# Patient Record
Sex: Female | Born: 1950 | Race: White | Hispanic: No | Marital: Married | Smoking: Never smoker
Health system: Southern US, Community
[De-identification: ages and names within clinical notes are randomized; demographics above are authoritative.]

## PROBLEM LIST (undated history)

## (undated) ENCOUNTER — Emergency Department (HOSPITAL_BASED_OUTPATIENT_CLINIC_OR_DEPARTMENT_OTHER): Payer: 59 | Source: Home / Self Care

## (undated) DIAGNOSIS — F32A Depression, unspecified: Secondary | ICD-10-CM

## (undated) DIAGNOSIS — M1990 Unspecified osteoarthritis, unspecified site: Secondary | ICD-10-CM

## (undated) DIAGNOSIS — S0181XA Laceration without foreign body of other part of head, initial encounter: Secondary | ICD-10-CM

## (undated) DIAGNOSIS — M797 Fibromyalgia: Secondary | ICD-10-CM

## (undated) DIAGNOSIS — N189 Chronic kidney disease, unspecified: Secondary | ICD-10-CM

## (undated) DIAGNOSIS — H9190 Unspecified hearing loss, unspecified ear: Secondary | ICD-10-CM

## (undated) DIAGNOSIS — H269 Unspecified cataract: Secondary | ICD-10-CM

## (undated) DIAGNOSIS — D649 Anemia, unspecified: Secondary | ICD-10-CM

## (undated) DIAGNOSIS — F329 Major depressive disorder, single episode, unspecified: Secondary | ICD-10-CM

## (undated) DIAGNOSIS — IMO0001 Reserved for inherently not codable concepts without codable children: Secondary | ICD-10-CM

## (undated) DIAGNOSIS — E039 Hypothyroidism, unspecified: Secondary | ICD-10-CM

## (undated) DIAGNOSIS — L409 Psoriasis, unspecified: Secondary | ICD-10-CM

## (undated) HISTORY — PX: COLONOSCOPY W/ POLYPECTOMY: SHX1380

## (undated) HISTORY — DX: Psoriasis, unspecified: L40.9

## (undated) HISTORY — DX: Depression, unspecified: F32.A

## (undated) HISTORY — DX: Hypothyroidism, unspecified: E03.9

## (undated) HISTORY — DX: Unspecified osteoarthritis, unspecified site: M19.90

## (undated) HISTORY — PX: ROTATOR CUFF REPAIR: SHX139

## (undated) HISTORY — DX: Fibromyalgia: M79.7

## (undated) HISTORY — DX: Major depressive disorder, single episode, unspecified: F32.9

---

## 1998-11-12 ENCOUNTER — Ambulatory Visit (HOSPITAL_COMMUNITY): Admission: RE | Admit: 1998-11-12 | Discharge: 1998-11-12 | Payer: Self-pay | Admitting: Obstetrics and Gynecology

## 1998-12-02 ENCOUNTER — Other Ambulatory Visit: Admission: RE | Admit: 1998-12-02 | Discharge: 1998-12-02 | Payer: Self-pay | Admitting: Obstetrics and Gynecology

## 1999-02-07 ENCOUNTER — Emergency Department (HOSPITAL_COMMUNITY): Admission: EM | Admit: 1999-02-07 | Discharge: 1999-02-07 | Payer: Self-pay | Admitting: Emergency Medicine

## 1999-02-07 ENCOUNTER — Encounter: Payer: Self-pay | Admitting: Emergency Medicine

## 1999-11-24 ENCOUNTER — Other Ambulatory Visit: Admission: RE | Admit: 1999-11-24 | Discharge: 1999-11-24 | Payer: Self-pay | Admitting: Obstetrics and Gynecology

## 2000-02-14 ENCOUNTER — Ambulatory Visit (HOSPITAL_COMMUNITY): Admission: RE | Admit: 2000-02-14 | Discharge: 2000-02-14 | Payer: Self-pay | Admitting: Endocrinology

## 2000-05-16 ENCOUNTER — Other Ambulatory Visit: Admission: RE | Admit: 2000-05-16 | Discharge: 2000-05-16 | Payer: Self-pay | Admitting: Obstetrics and Gynecology

## 2001-02-26 ENCOUNTER — Encounter: Admission: RE | Admit: 2001-02-26 | Discharge: 2001-02-26 | Payer: Self-pay | Admitting: *Deleted

## 2001-04-08 ENCOUNTER — Emergency Department (HOSPITAL_COMMUNITY): Admission: EM | Admit: 2001-04-08 | Discharge: 2001-04-08 | Payer: Self-pay | Admitting: Emergency Medicine

## 2001-06-25 ENCOUNTER — Encounter: Admission: RE | Admit: 2001-06-25 | Discharge: 2001-09-23 | Payer: Self-pay | Admitting: Endocrinology

## 2003-01-24 ENCOUNTER — Other Ambulatory Visit: Admission: RE | Admit: 2003-01-24 | Discharge: 2003-01-24 | Payer: Self-pay | Admitting: Obstetrics and Gynecology

## 2003-12-17 ENCOUNTER — Emergency Department (HOSPITAL_COMMUNITY): Admission: EM | Admit: 2003-12-17 | Discharge: 2003-12-17 | Payer: Self-pay | Admitting: Emergency Medicine

## 2004-10-31 HISTORY — PX: TOTAL HIP ARTHROPLASTY: SHX124

## 2004-11-30 ENCOUNTER — Other Ambulatory Visit: Admission: RE | Admit: 2004-11-30 | Discharge: 2004-11-30 | Payer: Self-pay | Admitting: Obstetrics and Gynecology

## 2005-08-23 ENCOUNTER — Inpatient Hospital Stay (HOSPITAL_COMMUNITY): Admission: RE | Admit: 2005-08-23 | Discharge: 2005-08-27 | Payer: Self-pay | Admitting: Orthopedic Surgery

## 2006-01-10 ENCOUNTER — Other Ambulatory Visit: Admission: RE | Admit: 2006-01-10 | Discharge: 2006-01-10 | Payer: Self-pay | Admitting: Obstetrics and Gynecology

## 2006-09-10 ENCOUNTER — Emergency Department (HOSPITAL_COMMUNITY): Admission: EM | Admit: 2006-09-10 | Discharge: 2006-09-10 | Payer: Self-pay | Admitting: Emergency Medicine

## 2007-12-17 ENCOUNTER — Encounter: Admission: RE | Admit: 2007-12-17 | Discharge: 2007-12-17 | Payer: Self-pay | Admitting: Nephrology

## 2008-04-21 ENCOUNTER — Encounter (INDEPENDENT_AMBULATORY_CARE_PROVIDER_SITE_OTHER): Payer: Self-pay | Admitting: *Deleted

## 2008-04-21 ENCOUNTER — Ambulatory Visit (HOSPITAL_COMMUNITY): Admission: RE | Admit: 2008-04-21 | Discharge: 2008-04-21 | Payer: Self-pay | Admitting: *Deleted

## 2010-02-22 ENCOUNTER — Encounter: Admission: RE | Admit: 2010-02-22 | Discharge: 2010-02-22 | Payer: Self-pay | Admitting: Nephrology

## 2010-10-31 HISTORY — PX: OTHER SURGICAL HISTORY: SHX169

## 2011-01-07 ENCOUNTER — Ambulatory Visit
Admission: RE | Admit: 2011-01-07 | Discharge: 2011-01-07 | Disposition: A | Discharge status: Home / Self Care | Payer: 59 | Source: Ambulatory Visit | Attending: Endocrinology | Admitting: Endocrinology

## 2011-01-07 ENCOUNTER — Inpatient Hospital Stay: Admission: RE | Admit: 2011-01-07 | Payer: Self-pay | Source: Ambulatory Visit

## 2011-01-07 ENCOUNTER — Other Ambulatory Visit: Payer: Self-pay | Admitting: Endocrinology

## 2011-01-07 DIAGNOSIS — R52 Pain, unspecified: Secondary | ICD-10-CM

## 2011-01-07 DIAGNOSIS — Z96649 Presence of unspecified artificial hip joint: Secondary | ICD-10-CM

## 2011-03-15 NOTE — Op Note (Signed)
NAMEFREDDYE, CARDAMONE                 ACCOUNT NO.:  [id number]   MEDICAL RECORD NO.:  [id number]          PATIENT TYPE:  AMB   LOCATION:  ENDO                         FACILITY:  [facility]   PHYSICIAN:  Georgiana Spinner, M.D.    DATE OF BIRTH:  12-19-1950   DATE OF PROCEDURE:  DATE OF DISCHARGE:                               OPERATIVE REPORT   PROCEDURE:  Colonoscopy.   INDICATIONS:  Colon cancer screening.   ANESTHESIA:  Fentanyl 100 mcg, Versed 6 mg.   PROCEDURE:  With the patient mildly sedated in the left lateral  decubitus position the Pentax videoscopic colonoscope was inserted in  the rectum and passed under direct vision to the cecum identified by  ileocecal valve and appendiceal orifice both which were photographed.  From this point the colonoscope was slowly withdrawn taking  circumferential views of colonic mucosa stopping in the ascending colon  where two polyps adjacent to each other were seen, photographed and  removed using hot biopsy forceps technique setting of 20/150 blended  current.  We then withdrew the colonoscope taking circumferential views  of the remaining colonic mucosa stopping only then in the rectum which  appeared normal on direct, showed hemorrhoids on retroflexed view.  The  endoscope was straightened, withdrawn.  The patient's vital signs, pulse  oximeter remained stable.  The patient tolerated the procedure well  without apparent complication.   FINDINGS:  1. Internal hemorrhoids.  2. Polyps of ascending colon, await biopsy report.  3. The patient will call me for results and follow-up with me as      needed as an outpatient.           ______________________________  Georgiana Spinner, M.D.     GMO/MEDQ  D:  04/21/2008  T:  04/21/2008  Job:  [id number]   cc:   Dineen Kid. Rana Snare, M.D.  Fax: [phone]   Hope M. Danella Deis, M.D.  Fax: [phone]

## 2011-03-18 NOTE — Discharge Summary (Signed)
NAMESTEFANI, [name]                 ACCOUNT NO.:  [id number]   MEDICAL RECORD NO.:  [id number]          PATIENT TYPE:  INP   LOCATION:  1510                         FACILITY:  [facility]   PHYSICIAN:  Madlyn Frankel. Charlann Boxer, M.D.  DATE OF BIRTH:  08/02/1951   DATE OF ADMISSION:  08/23/2005  DATE OF DISCHARGE:  08/27/2005                                 DISCHARGE SUMMARY   ADMISSION DIAGNOSES:  1.  Severe right hip osteoarthritis.  2.  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  3.  Hypothyroidism.  4.  Asthma.  5.  Fibromyalgia.  6.  Psoriasis.  7.  History of urinary tract infection.  8.  History of renal calculi.  9.  Colic.  10. Constipation.  11. Osteoarthritis.   DISCHARGE DIAGNOSES:  1.  Severe hip osteoarthritis.  2.  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  3.  Hypothyroidism.  4.  Asthma.  5.  Fibromyalgia.  6.  Psoriasis.  7.  History of urinary tract infection.  8.  History of renal calculi.  9.  Colic.  10. Constipation.  11. Osteoarthritis.  12. Mild postoperative anemia.   OPERATION:  On August 23, 2005, the patient underwent right total hip  replacement and arthroplasty with #22.0 modified for an added difficulty  secondary to obesity.   ASSISTANT:  D.L. Cherlynn June.   BRIEF HISTORY AND PHYSICAL:  This is a 60 year old lady with deteriorating  problems concerning her right hip with osteoarthritis.  Hip replacement was  put off for some time because she wanted to continue working until she  really could not get around anymore.  That has occurred where she has  difficulty getting about.  Hip injections, antiinflammatories and ambulatory-  assistant device have been used, but now does not allow her the activity she  desires.  After much discussion including the risks and benefits of surgery,  it was decided to go ahead with the above procedure.   COURSE IN THE EMERGENCY ROOM:  The patient tolerated the surgical procedure  quite well.  She is very slow with her early  activities due to her size, but  she did push through as much as she could for her postoperative hip  protocol.  She was placed on Coumadin postoperatively for prevention of DVT,  utilizing compression hose and sequential compression device.  A rolling  walker was needed with larger wheels and a larger commode seat.  She was  ambulating in the hallway, and felt safe and confident to go home.  The  wound was dry.  Neurovascular intact to the operative extremity, and the  calf was soft.  It was felt she could be maintained in her home environment  so arrangements were made for discharge.  She was noted to have a low-grade  temp.  We encouraged the incentive spirometer, and used Tylenol.   LABORATORY DATA:  Laboratory values in the hospital, hematologically, showed  admission CBC which was completely within normal limits.  Hemoglobin was  13.6; hematocrit was 39.2.  Final hemoglobin was 10.4.  Hematocrit was 29.7.  Chemistries remained  normal throughout her hospitalization.  Urinalysis  showed some leukocyte esterase but not really a urinary tract infection, and  the patient did receive perioperative antibiotics.  EKG showed a normal  sinus rhythm.  No chest x-ray seen on this chart.   CONDITION ON DISCHARGE:  Improved, and stable.   PLAN:  The patient was discharged to her home to continue with incentive  spirometer, and use laxative of choice.  She will return to see Dr. Charlann Boxer 2  weeks after the date of surgery, continue with home medications and diet.  Use dry dressings p.r.n. to the wound.  Robaxin 500 mg for muscle relaxants  and Coumadin per pharmacy as well as Vicodin for pain.  She is urged to call  if any problems.  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   DICTATED FOR:  Madlyn Frankel. Charlann Boxer, M.D.      Dooley L. Cherlynn June.      Madlyn Frankel Charlann Boxer, M.D.  Electronically Signed    DLU/MEDQ  D:  09/02/2005  T:   09/02/2005  Job:  [id number]   cc:   Madlyn Frankel Charlann Boxer, M.D.  Fax: [phone]

## 2011-03-18 NOTE — H&P (Signed)
[name], [name]                 ACCOUNT NO.:  [id number]   MEDICAL RECORD NO.:  [id number]          PATIENT TYPE:   LOCATION:                                 FACILITY:   PHYSICIAN:  Madlyn Frankel. Charlann Boxer, M.D.  DATE OF BIRTH:  30-Jun-1951   DATE OF ADMISSION:  08/23/2005  DATE OF DISCHARGE:                                HISTORY & PHYSICAL   PRINCIPAL DIAGNOSIS:  Right hip osteoarthritis.   SECONDARY DIAGNOSES:  1.  History of a club foot on the right.  2.  History of hypothyroidism.  3.  History of asthma.  4.  Fibromyalgia.  5.  Psoriasis.  6.  History of urinary tract infections.  7.  Kidney stones.  8.  Colic.  9.  Constipation.  10. Osteoarthritis.  11. History of menopause.  12. Right shoulder rotator cuff repair.  13. History of calcium deposits in the right shoulder.   HISTORY:  [name] is a 60 year old female who I have been following in the  office with a history of right hip osteoarthritis. She has been followed and  managed in a conservative fashion including injections and attempts at  limited activity and antiinflammatories. These have not provided any long-  term or adequate relief. Quality of life is basically diminished  significantly as she is unable to perform her job due to her discomfort.   At this point after reviewing and failing of conservative measures, she  wished to proceed with a right total hip replacement.   PAST MEDICAL HISTORY:  History of club foot, hypothyroidism, asthma,  fibromyalgia, psoriasis, frequent urinary tract infections, kidney stones,  colic, history of gallbladder disease, constipation, osteoarthritis,  menopause.   PAST SURGICAL HISTORY:  Right shoulder partial rotator cuff repair and  excision of calcium deposits.   FAMILY HISTORY:  Diabetes, lung disease, stroke.   ALLERGIES:  CODEINE and IVP DYE.   MEDICATIONS:  Ultracet, bupropion, Tricor, Altace, levothyroxine,  multivitamin, Flovent inhaler, Singulair,  furosemide, ? tablets, loratadine,  folic acid and calcium.   REVIEW OF SYMPTOMS:  Otherwise unremarkable particularly for any  genitourinary, upper respiratory, pulmonary or cardiac issues.   PHYSICAL EXAMINATION:  VITAL SIGNS:  Temperature of 99.0, pulse 68, blood  pressure 140/72.  GENERAL:  She is awake, alert, oriented and cooperative.  HEENT:  Examination of the neck reveals a supple neck exam with no nodes  palpable. She has no bruits detected.  CHEST:  Clear to auscultation bilaterally. No rales.  HEART:  Regular rate and rhythm with no murmurs detected.  ABDOMEN:  Obese with palpable bowel sounds.  EXTREMITIES:  Painful limited range of motion has been previously noted on  examination and is neurovascularly intact. She has palpable pulses, intact  motor and sensory function, no evidence of psoriasis around the right hip.   Radiographs revealed right hip end-stage osteoarthritis.   IMPRESSION:  Right hip osteoarthritis.   PLAN:  The patient will be admitted for same day surgery on August 23, 2005. We had an extensive discussion today on risks and benefits including  bearing surfaces. The plan will  be for Ms. Hagee to have a ceramic on  polyethelene total hip replacement on the right. Radiographs reveal  significant valgus position of her femoral neck and this will be fully  evaluated in the OR. Questions were encouraged and answered.      Madlyn Frankel Charlann Boxer, M.D.  Electronically Signed     MDO/MEDQ  D:  08/25/2005  T:  08/25/2005  Job:  [id number]

## 2011-03-18 NOTE — Op Note (Signed)
[name], Parrish                 ACCOUNT NO.:  [id number]   MEDICAL RECORD NO.:  [id number]          PATIENT TYPE:  INP   LOCATION:  0007                         FACILITY:  [facility] [place]   PHYSICIAN:  Madlyn Frankel. Charlann Boxer, M.D.  DATE OF BIRTH:  [birth year]   DATE OF PROCEDURE:  08/23/2005  DATE OF DISCHARGE:                                 OPERATIVE REPORT   PREOPERATIVE DIAGNOSIS:  Right hip osteoarthritis.   POSTOPERATIVE DIAGNOSIS:  Right hip osteoarthritis.   PROCEDURE:  Right total hip replacement with 22 modifier for added  difficulty secondary to obesity.   SURGEON:  Madlyn Frankel. Charlann Boxer, M.D.   ASSISTANTDruscilla Brownie. Cherlynn June.   COMPONENTS USED:  DePuy hip system with a size 52 Pinnacle cup with a 32  neutral Marathon liner, a 7.5 mm Trilock standard femoral stem with a 32+1  ceramic and Delta ball.   ANESTHESIA:  General.   ESTIMATED BLOOD LOSS:  450.   DRAINS:  One.   COMPLICATIONS:  None.   INDICATIONS FOR PROCEDURE:  [name] is a 60 year old female who I have been  following for some time for right hip osteoarthritis in addition to social  history regarding her working and wanting to work as long as possible as  well as her size.  Hip replacement has been put off for some time.  Conservative measures have all failed, including hip injections, use of  assistive device and anti-inflammatories.  At this point, she wishes to  proceed with the hip replacement.  She does have a history of chronic  proteinuria with a questionable renal history.  Based on her size and  concerns with ceramics and fracture as well as the concern for metal-on-  metal ion debris and her kidney function, the decision was made for a  ceramic-on-polyethylene liner.  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   PROCEDURE IN DETAIL:  Patient was brought to the operating  theater.  Once  adequate anesthesia, preoperative antibiotics with 1 gm of Ancef were  administered, the patient was positioned in the left lateral decubitus  position with the right side up.  The curvilinear, lateral-based incision  was made for a posterior approach to the hip.  The patient was noted to have  an approximately 5-6 inches worth of subcutaneous fat prior to reaching her  iliotibial band.  The iliotibial band and gluteus maximus fascia were  incised in line with the incision.  Short external rotators were taken down  separate from the posterior capsule.  A capsulectomy was performed  posteriorly.  The hip was dislocated.  The patient was noted to have pretty  significant osteophytes posteriorly and anteriorly.  A neck osteotomy was  made based off the preoperative templating and anatomic landmarks.  Attention was first directed to the femur.  Femoral exposure was obtained.  A starting box cut was positioned in the posterior aspect of the femur to  allow for enough anteversion of the femur.  Broaching commenced with the  smallest broach and was carried up to a 7.5, which had excellent purchase.  With this, attention was directed to the acetabulum.  Acetabular exposure  was obtained in a routine fashion with the use of routine retractors.   The patient was noted to have pretty significant osteophytes.  The portion  of the posterior osteophyte, not acetabular wall, had fractured off of this,  dislocating the head.  These were removed at th time of placement of the  final cup.  I used minimally invasive curved reamers to get in to the size  of her hip.  Reaming commenced through 43 reamers straight through to the  medial wall.  I then reamed up the 51 reamer.  A final 52 cup was then  positioned with the cup positioned at 45 degrees of abduction and 10-15  degrees of forward flexion at her anterior rim.  The posterior aspect of the  cup appeared to be at the level of the ischium  posteriorly.   A single cancellous screw was placed in the ileum, which had excellent  purchase.  Combining with the initial scratch, no other screws were placed.  A trial liner was then placed.  Attention was directed to a trial reduction.  The 7.5 broach was positioned with a standard neck and 32 head.  The leg  lengths appeared equal, compared to the preoperative state, compared to the  down leg.  There was about 1 mm of shuck.  The hip range of motion was noted  to be very good.  Final components were brought to the field.  Once the  trial components were removed, I did pack some bone graft behind the  acetabular shell through the central hole and then placed a man hole cover.  The final 32 neutral Marathon liner was then impacted into the shell without  difficulty.  The final 7.5 standard Trilock stem was then impacted to a  level of the broach.  Based on this, the final 32+1 ball was then impacted  on a clean, dried trunnion.  The hip reduced.   The hip was copiously irrigated throughout the case and at the end.  A  medium Hemovac drain was placed deep.  Short external rotators were not able  to be reapproximated without excessive tension and were not reapproximated.  Capsulectomy was performed.  The iliotibial band was reapproximated using #1  Ethibond, then the gluteus fascia with #1 Vicryl.  The remainder of the  wound was closed in layers with a running 4-0 Monocryl on the skin.  The hip  was cleaned, dried, and dressed sterilely with Steri-Strips, dressings,  sponge, and tape.  Patient was extubated and brought to the recovery room in  stable condition.      Madlyn Frankel Charlann Boxer, M.D.  Electronically Signed     MDO/MEDQ  D:  08/23/2005  T:  08/23/2005  Job:  [id number]

## 2011-08-23 ENCOUNTER — Other Ambulatory Visit (HOSPITAL_COMMUNITY): Payer: Self-pay | Admitting: Orthopedic Surgery

## 2011-08-23 ENCOUNTER — Other Ambulatory Visit: Payer: Self-pay | Admitting: Orthopedic Surgery

## 2011-08-23 ENCOUNTER — Other Ambulatory Visit: Payer: Self-pay | Admitting: Nephrology

## 2011-08-23 ENCOUNTER — Ambulatory Visit (HOSPITAL_COMMUNITY)
Admission: RE | Admit: 2011-08-23 | Discharge: 2011-08-23 | Disposition: A | Discharge status: Home / Self Care | Payer: 59 | Source: Ambulatory Visit | Attending: Orthopedic Surgery | Admitting: Orthopedic Surgery

## 2011-08-23 ENCOUNTER — Encounter (HOSPITAL_COMMUNITY): Payer: 59

## 2011-08-23 DIAGNOSIS — M1710 Unilateral primary osteoarthritis, unspecified knee: Secondary | ICD-10-CM

## 2011-08-23 DIAGNOSIS — E7800 Pure hypercholesterolemia, unspecified: Secondary | ICD-10-CM | POA: Insufficient documentation

## 2011-08-23 DIAGNOSIS — J45909 Unspecified asthma, uncomplicated: Secondary | ICD-10-CM | POA: Insufficient documentation

## 2011-08-23 DIAGNOSIS — IMO0001 Reserved for inherently not codable concepts without codable children: Secondary | ICD-10-CM | POA: Insufficient documentation

## 2011-08-23 DIAGNOSIS — Z01818 Encounter for other preprocedural examination: Secondary | ICD-10-CM | POA: Insufficient documentation

## 2011-08-23 DIAGNOSIS — Z79899 Other long term (current) drug therapy: Secondary | ICD-10-CM | POA: Insufficient documentation

## 2011-08-23 DIAGNOSIS — Z01812 Encounter for preprocedural laboratory examination: Secondary | ICD-10-CM | POA: Insufficient documentation

## 2011-08-23 DIAGNOSIS — Z01810 Encounter for preprocedural cardiovascular examination: Secondary | ICD-10-CM | POA: Insufficient documentation

## 2011-08-23 LAB — DIFFERENTIAL
Basophils Relative: 0 % (ref 0–1)
Eosinophils Absolute: 0.5 10*3/uL (ref 0.0–0.7)
Eosinophils Relative: 7 % — ABNORMAL HIGH (ref 0–5)
Lymphs Abs: 2.3 10*3/uL (ref 0.7–4.0)
Neutrophils Relative %: 54 % (ref 43–77)

## 2011-08-23 LAB — PROTIME-INR: Prothrombin Time: 13.9 seconds (ref 11.6–15.2)

## 2011-08-23 LAB — IRON AND TIBC
Saturation Ratios: 25 % (ref 20–55)
UIBC: 234 ug/dL (ref 125–400)

## 2011-08-23 LAB — URINALYSIS, ROUTINE W REFLEX MICROSCOPIC
Leukocytes, UA: NEGATIVE
Nitrite: NEGATIVE
Protein, ur: 100 mg/dL — AB
Urobilinogen, UA: 0.2 mg/dL (ref 0.0–1.0)

## 2011-08-23 LAB — COMPREHENSIVE METABOLIC PANEL
AST: 23 U/L (ref 0–37)
CO2: 22 mEq/L (ref 19–32)
Calcium: 9.8 mg/dL (ref 8.4–10.5)
Creatinine, Ser: 1.71 mg/dL — ABNORMAL HIGH (ref 0.50–1.10)
GFR calc Af Amer: 37 mL/min — ABNORMAL LOW (ref >90)
GFR calc non Af Amer: 32 mL/min — ABNORMAL LOW (ref >90)
Glucose, Bld: 86 mg/dL (ref 70–99)
Total Protein: 7.1 g/dL (ref 6.0–8.3)

## 2011-08-23 LAB — URINE MICROSCOPIC-ADD ON

## 2011-08-23 LAB — PROTEIN, URINE, RANDOM: Total Protein, Urine: 107 mg/dL

## 2011-08-23 LAB — SURGICAL PCR SCREEN
MRSA, PCR: NEGATIVE
Staphylococcus aureus: NEGATIVE

## 2011-08-23 LAB — CBC
MCV: 90.5 fL (ref 78.0–100.0)
Platelets: 228 10*3/uL (ref 150–400)
RDW: 13.1 % (ref 11.5–15.5)
WBC: 6.7 10*3/uL (ref 4.0–10.5)

## 2011-08-23 LAB — APTT: aPTT: 32 seconds (ref 24–37)

## 2011-08-23 LAB — T4: T4, Total: 9.8 ug/dL (ref 5.0–12.5)

## 2011-08-30 ENCOUNTER — Ambulatory Visit (HOSPITAL_COMMUNITY)
Admission: RE | Admit: 2011-08-30 | Discharge: 2011-09-01 | Disposition: A | Discharge status: Home Health | Payer: 59 | Source: Ambulatory Visit | Attending: Orthopedic Surgery | Admitting: Orthopedic Surgery

## 2011-08-30 DIAGNOSIS — J45909 Unspecified asthma, uncomplicated: Secondary | ICD-10-CM | POA: Insufficient documentation

## 2011-08-30 DIAGNOSIS — E079 Disorder of thyroid, unspecified: Secondary | ICD-10-CM | POA: Insufficient documentation

## 2011-08-30 DIAGNOSIS — M1710 Unilateral primary osteoarthritis, unspecified knee: Principal | ICD-10-CM | POA: Insufficient documentation

## 2011-08-30 DIAGNOSIS — N182 Chronic kidney disease, stage 2 (mild): Secondary | ICD-10-CM | POA: Insufficient documentation

## 2011-08-30 DIAGNOSIS — Z96649 Presence of unspecified artificial hip joint: Secondary | ICD-10-CM | POA: Insufficient documentation

## 2011-08-30 DIAGNOSIS — IMO0001 Reserved for inherently not codable concepts without codable children: Secondary | ICD-10-CM | POA: Insufficient documentation

## 2011-08-30 DIAGNOSIS — L408 Other psoriasis: Secondary | ICD-10-CM | POA: Insufficient documentation

## 2011-08-30 DIAGNOSIS — E7800 Pure hypercholesterolemia, unspecified: Secondary | ICD-10-CM | POA: Insufficient documentation

## 2011-08-30 LAB — TYPE AND SCREEN

## 2011-08-30 LAB — ABO/RH: ABO/RH(D): AB POS

## 2011-08-30 LAB — GLUCOSE, CAPILLARY

## 2011-08-31 LAB — BASIC METABOLIC PANEL
CO2: 20 mEq/L (ref 19–32)
Calcium: 9 mg/dL (ref 8.4–10.5)
Creatinine, Ser: 2.06 mg/dL — ABNORMAL HIGH (ref 0.50–1.10)
Glucose, Bld: 106 mg/dL — ABNORMAL HIGH (ref 70–99)

## 2011-08-31 LAB — CBC
HCT: 30.9 % — ABNORMAL LOW (ref 36.0–46.0)
Hemoglobin: 10.2 g/dL — ABNORMAL LOW (ref 12.0–15.0)
MCH: 30.1 pg (ref 26.0–34.0)
MCV: 91.2 fL (ref 78.0–100.0)
RBC: 3.39 MIL/uL — ABNORMAL LOW (ref 3.87–5.11)

## 2011-09-01 NOTE — H&P (Signed)
[name], [name]                 ACCOUNT NO.:  [id number]  MEDICAL RECORD NO.:  [id number]  LOCATION:                               FACILITY:  [facility]  PHYSICIAN:  Madlyn Frankel. Charlann Boxer, M.D.  DATE OF BIRTH:  1951-06-19  DATE OF ADMISSION:  08/30/2011 DATE OF DISCHARGE:                             HISTORY & PHYSICAL   DATE OF SURGERY:  August 30, 2011.  CHIEF COMPLAINT:  Left knee osteoarthritis, medial greater than lateral.  HISTORY OF PRESENT ILLNESS:  The patient is a 60 year old white female, in no acute distress.  The patient has left knee pain for a number of years.  The patient has tried conservative treatments including steroid injections and anti-inflammatories, which have been minimally effective at best.  The patient has also had a U-Flex injection which did help for sometime, but the pain quickly returned a little while and after x-rays in the clinic do show osteoarthritis changes medial greater than lateral without bone-on-bone medial aspect.  Options were discussed with the patient.  The patient wished to proceed with surgery.  Risks, benefits, and expectations of procedure were discussed with the patient.  The patient understands risks, benefits, and expectation and wished to proceed with a left knee unilateral knee replacement on the medial aspect.  On discharge, the patient is planning on going home.  The patient's pain medicines have been given including Robaxin, Colace, MiraLax, and iron.  The patient has not been given anything for anticoagulation due to not able to be taking aspirin at this time.  The patient is a candidate for tranexamic acid and will be given this prior to surgery.  PRIMARY CARE PHYSICIAN:  Adela Lank, MD.  PAST MEDICAL HISTORY: 1. Asthma. 2. Hypercholesteremia. 3. History of shingles. 4. Thyroid disease. 5. Kidney disease. 6. Fibromyalgia. 7. Arthritis. 8. Psoriasis. 9. Peripheral edema.  PAST SURGICAL HISTORY: 1. Removal of  calcium deposits in the right upper arm. 2. Results from the first surgery caused a partial tear of the rotator     cuff which was repaired and a subsequent surgery. 3. Right total hip arthroplasty.  MEDICATIONS: 1. Ramipril 5 mg 1 p.o. in a.m. and 3 p.o. in the p.m. 2. Bupropion HCl SR 150 mg 1 p.o. b.i.d. 3. Singular 10 mg 1 p.o. at bedtime. 4. Furosemide 40 mg daily. 5. Tramadol 50 mg 2-3 tabs daily. 6. Synthroid 112 mcg daily. 7. Crestor 20 mg daily. 8. Fish oil 1500 mg b.i.d. (stopped a week prior to surgery. 9. Multivitamin 1 p.o. daily (stopped a week before surgery). 10.Albendazole ointment. 11.Fluocinonide ointment. 12.Albuterol inhaler p.r.n.  ALLERGIES: 1. CODEINE causes nausea and vomiting.  The patient does take     hydrocodone without problems, just limited. 2. IVP DYE causes anaphylaxis. 3. ROCEPHALIN causes anaphylaxis. 4. PAPER TAPE causes an allergic reaction on the skin.  SOCIAL HISTORY:  The patient denies use of alcohol or tobacco.  REVIEW OF SYSTEMS:  GENERAL:  The patient has recently been attempting to lose weight and has lost 20 pounds.  HEENT:  Hearing loss. DERMATOLOGY:  Causes itching psoriasis.  RESPIRATORY:  History of allergies.  GI:  Constipation issues.  MUSCULOSKELETAL:  Joint pain, muscular pain, spasms, and morning stiffness, otherwise unremarkable.  PHYSICAL EXAMINATION:  GENERAL:  The patient is a 59 year old, white female, in no acute distress. VITAL SIGNS:  Stable.  Blood pressure in the left arm is 134/78, pulse 86, and respirations 18. HEENT:  Pupils equal, round, react well to light and accommodation. Throat is clear. NECK:  Supple.  No JVD noted.  No carotid bruits.  No lymphadenopathy noted. CARDIA:  Normal-appearing S1 and S2.  No murmur appreciated. RESPIRATORY:  Lungs clear to auscultation bilaterally. NEUROLOGIC:  The patient is oriented x3. ORTHOPEDICS:  Pertaining to the left knee, the patient has pain on palpation over  the medial aspect.  There is no pain of the lateral joint line.  There is no pain of the quadriceps tendon, patellar tendon, or patella.  The patient does have range of motion from 0-100 with discomfort at the extremes of her range of motion.  She is distally neurovascularly intact.  The patient does have some distal lower leg swelling.  IMPRESSION:  Left knee osteoarthritis.  STUDIES:  Showed left knee osteoarthritis with bone-on-bone changes. Palpation on lateral aspect is  stable.  PLAN:  The patient will be admitted to the hospital to undergo left knee unilateral knee replacement on the medial aspect per Dr. Charlann Boxer at [facility] on August 30, 2011, on current observation basis.  Risks, benefits, and expectation of procedure discussed with the patient.  The patient understands risks, benefits, and expectations and wishes to proceed with surgery.    ______________________________ Lanney Gins, PA   ______________________________ Madlyn Frankel. Charlann Boxer, M.D.    MB/MEDQ  D:  08/26/2011  T:  08/26/2011  Job:  [id number]  Electronically Signed by Lanney Gins PA on 08/30/2011 02:17:32 PM Electronically Signed by Durene Romans M.D. on 09/01/2011 11:15:02 AM

## 2011-09-01 NOTE — Discharge Summary (Signed)
[name], [name] NO.:  [id number]  MEDICAL RECORD NO.:  [id number]  LOCATION:  1608                         FACILITY:  [facility]  PHYSICIAN:  [name] Frankel. Charlann Parrish, M.D.  DATE OF BIRTH:  [birth year]  DATE OF ADMISSION:  08/30/2011 DATE OF DISCHARGE:  08/31/2011                              DISCHARGE SUMMARY   PROCEDURES:  Left unicompartmental knee arthroplasty.  ADMITTING DIAGNOSIS:  Left knee osteoarthritis, medial compartment.  DISCHARGE DIAGNOSES: 1. Status post left unicompartmental knee arthroplasty, medial. 2. Asthma. 3. Hypercholesteremia. 4. History of shingles. 5. Thyroid disease. 6. Kidney disease. 7. Fibromyalgia. 8. Arthritis. 9. Stresses. 10.Peripheral edema.  HISTORY OF PRESENT ILLNESS:  The patient is a 60 year old white female, in no acute distress.  The patient has left knee pain for a number of years.  The patient has tried conservative treatment including steroid injections and antiinflammatories, which had been minimally effective at past.  The patient has also had a duplex injection which did help for some time.  The pain quickly returned.  X-rays in the clinic do reveal osteoarthritic changes on the medial aspect with stable lateral aspect. Options were discussed with the patient.  The patient was proceed with surgery.  Risks, benefits, and expectations of procedure were discussed. The patient understands risks, benefits, and expectations and wished to proceed with the left unicompartmental knee arthroplasty per Dr. Charlann Parrish at [facility].  HOSPITAL COURSE:  The patient underwent the above-stated procedure on August 30, 2011.  The patient tolerated the procedure well, was brought to the recovery room in good condition, and subsequently to the floor.  Postop day #1, August 31, 2011, the patient is doing well, no events. Pain is controlled.  She is afebrile.  Vital signs stable.  H and H are 10.2/30.9.  She is distally  neurovascular intact.  Dressing is good, clean, dry, and intact.  The patient does have some cellulitis and will be discharged home on amoxicillin.  The patient will have physical therapy and occupational therapy x2.  Hemovac drain was removed.  Ace bandage was removed.  I felt that the patient must be doing well enough to be discharged home.  DISCHARGE CONDITION:  Good.  DISCHARGE INSTRUCTIONS:  The patient will be discharged home with home health PT after having physical therapy in the hospital.  The patient will be weightbearing as tolerated.  Should maintain her surgical dressings for about 8 days after which time she will replace with gauze and tape.  The patient to keep the area dry and clean until followup. She will follow up in 2 weeks at [facility].  The patient is to call with any questions or concerns.  DISCHARGE MEDICATIONS: 1. Benadryl 25 mg one p.o. q.4 h. p.r.n. 2. Colace 100 mg one p.o. b.i.d., constipation. 3. Iron sulfate 325 mg one p.o. t.i.d. x2-3 weeks. 4. Norco 7.5/325, 1-2 p.o. q.4-6 h. p.r.n. pain. 5. Robaxin 500 mg one p.o. q.6 h. p.r.n. muscle spasms. 6. MiraLax 17 g p.o. q. day, constipation. 7. Xarelto 10 mg p.o. q. day for 14 days, anticoagulation. 8. Albuterol inhaler 2 puffs inhaled every 4 hours as needed for  shortness of breath. 9. Altace 5 mg, 1 capsule in the morning and 3 capsules in the     evening. 10.Crestor 20 mg one p.o. q.a.m. 11.Fish oil 1 p.o. q. day. 12.Fluocinonide topical 0.5%, 1 application topically daily. 13.Lasix 40 mg one p.o. q. day. 14.Multivitamin 1 p.o. q. day. 15.Singular 10 mg one p.o. nightly. 16.Synthroid 112 mcg one p.o. q.a.m. 17.Ultravate cream 0.05%, 1 application topically daily. 18.Wellbutrin SR 150 mg one p.o. b.i.d.    ______________________________ [name] Gins, PA   ______________________________ [name] Frankel. Charlann Parrish, M.D.    MB/MEDQ  D:  08/31/2011  T:  08/31/2011  Job:  [id number]  cc:    Brooke Bonito, M.D. Fax: [phone]  Electronically Signed by [name] Gins PA on 09/01/2011 10:32:58 AM Electronically Signed by Durene Romans M.D. on 09/01/2011 11:15:57 AM

## 2011-09-01 NOTE — Op Note (Signed)
[name] NO.:  [id number]  MEDICAL RECORD NO.:  [id number]  LOCATION:  1608                         FACILITY:  [facility]  PHYSICIAN:  Madlyn Frankel. Charlann Boxer, M.D.  DATE OF BIRTH:  Jan 25, 1951  DATE OF PROCEDURE:  08/30/2011 DATE OF DISCHARGE:                              OPERATIVE REPORT   PREOPERATIVE DIAGNOSIS:  Left knee medial compartment osteoarthritis.  POSTOPERATIVE DIAGNOSIS:  Left knee medial compartment osteoarthritis.  PROCEDURE:  Left partial knee replacement utilizing Biomet Oxford knee implant size medium femur, left 8 tibial tray and size 4 insert.  SURGEON:  Madlyn Frankel. Charlann Boxer, M.D.  ASSISTANT:  Lanney Gins, [facility]  ANESTHESIA:  General.  SPECIMENS:  None.  COMPLICATION:  None.  BLOOD LOSS:  Less than 100 mL.  TOURNIQUET TIME:  58 minutes at 250 mmHg.  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  She has had progressive worsening of her left knee symptoms with radiographic changes consistent with bone on bone arthritis, predominantly localized to the medial compartment with an anterior medial wear pattern noted.  Radiographic workup revealed medial compartment degenerative changes with predominant issue given the desired activity level as well as options for future surgeries.  She wished at this point to proceed with partial knee replacement.  She has failed injections, medications, activity modification, and attempted weight loss.  Consent was obtained for benefit of pain relief.  PROCEDURE IN DETAIL:  The patient was brought to the operative theater. Once adequate anesthesia, preoperative antibiotics, Ancef 2 g administered, she was positioned in supine.  Left thigh tourniquet was placed.  Her left leg was placed over the Oxford leg holder to allow for 120 degrees of flexion.  The right leg was placed into one of the lithotripsy leg holders to allow for abduction and  stabilization of the leg, so it will not fall off the table.  Once she was positioned, the left lower extremity was prepped and draped in sterile fashion from the tourniquet to the ankle.  The time-out was performed, identifying the patient, planned procedure, and extremity.  The left lower extremity was then exsanguinated, tourniquet elevated to 250 mmHg.  Midline incision was made Not intended to be minimally invasive procedure due to her size.  Soft tissue planes were created, followed by partial median arthrotomy to allow for patella subluxation laterally.  Following initial exposure of synovectomy and debridement of osteophytes, retractor was placed medially.  Attention was first directed to the tibial cuff, where the smaller spoon placed on the tibia on the curve of the distal femur.  The extramedullary guide was attached with a 4 mm cutting block.  It was pinned into position and confirmed in its orientation.  The reciprocating saw was used first along the spine for the tibia and followed by the oscillating saw to remove the segment. At this point, I confirmed the cut would be stable with a size 8 tibial tray and that the insert would be stable with 4 or  5 insert.  At this point, the femoral canal was opened and drilled and then intramedullary rod passed.  With the  guide set to a size 4 cut, it was placed on the cut surface of the tibia and one in the medial third of the femur.  The drill holes were made with posterior cutting block.  I chose at this point to use a medium femur to try to provide a little bit more stability and flexion based on the gaps.  The posterior cut was then made off the posterior aspect of femur.  At this point, I went ahead and  milled with a size 2 spigot in place. Following removal of bone, a trial reduction was carried out with this medium femur, 8 tibial tray.  In flexion, the size 4 insert felt stable at 90 degrees of flexion, at 20 degrees  of flexion; however, the 2 mm insert was seemed to be best.  Given this, I went ahead and removed the trial components with final preparation of the tibia by pinning it in position and drilling through the trough for the keel, removed remaining bone and placed the keeled trial implant.  We then placed a 4 spigot into the central hole of the femur.  Near the distal femur, we removed excessive bone.  At this point, a repeat trial reduction at this point with the lollipop trials indicated a stable implant from extension to flexion, was symmetric and balanced ligaments.  Given all these findings, the trial components were removed.  The drilled sclerotic bone for cement interdigitation.  The anterior aspect of the distal femur was then milled to allow for mobility of the insert.  Slit was made single batch of cement was mixed in a two-stage technique and irrigated the knee out.  The tibial component was cemented first with the trial femur and the knee held at 45 degrees of flexion.  After a minute and half, the femoral component was cemented into place.  Extruded cement was removed. The knee again was about 45 degrees of flexion and the cement was allowed to fully cure.  Once cement had fully cured, excess cement was removed throughout the knee.  I chose the 4 insert to finally trial. The 4 insert was snapped into place.  We irrigated the knee again, the tourniquet was let down after 58 minutes.  The medium Hemovac drain was placed deep.  The extensor mechanism was then reapproximated using 1 Vicryl with the knee in flexion.  The remaining wound was closed with 2-0 Vicryl and running 4-0 Monocryl.  The knee was cleaned, dried, dressed sterilely using Dermabond and Aquacel dressing. The patient was then brought to recovery room with Ace wrap over the knee and tolerated the procedure well without complication.     Madlyn Frankel Charlann Boxer, M.D.     MDO/MEDQ  D:  08/30/2011  T:  08/30/2011   Job:  [id number]  Electronically Signed by Durene Romans M.D. on 09/01/2011 11:15:29 AM

## 2011-09-04 NOTE — Discharge Summary (Signed)
  NAMESABEEN, PIECHOCKI NO.:  [id number]  MEDICAL RECORD NO.:  [id number]  LOCATION:  1608                         FACILITY:  [facility]  PHYSICIAN:  Madlyn Frankel. Charlann Boxer, M.D.  DATE OF BIRTH:  1950/11/16  DATE OF ADMISSION:  08/30/2011 DATE OF DISCHARGE:  09/01/2011                              DISCHARGE SUMMARY   ADDENDUM:  Due to pain and the patient not feeling well, the patient did not feel ready to be discharged home yesterday.  The patient does feel much better this morning, and feels that she is ready at this point. The patient will be discharged home with Home Health PT on September 01, 2011, after having physical therapy.  No changes in her discharge plans.    ______________________________ Lanney Gins, PA   ______________________________ Madlyn Frankel. Charlann Boxer, M.D.    MB/MEDQ  D:  09/01/2011  T:  09/01/2011  Job:  [id number]  Electronically Signed by Durene Romans M.D. on 09/04/2011 06:17:47 PM

## 2011-09-13 ENCOUNTER — Emergency Department (HOSPITAL_BASED_OUTPATIENT_CLINIC_OR_DEPARTMENT_OTHER)
Admission: EM | Admit: 2011-09-13 | Discharge: 2011-09-13 | Disposition: A | Discharge status: Home / Self Care | Payer: 59 | Attending: Emergency Medicine | Admitting: Emergency Medicine

## 2011-09-13 ENCOUNTER — Encounter: Payer: Self-pay | Admitting: *Deleted

## 2011-09-13 ENCOUNTER — Emergency Department (INDEPENDENT_AMBULATORY_CARE_PROVIDER_SITE_OTHER): Payer: 59

## 2011-09-13 DIAGNOSIS — R05 Cough: Secondary | ICD-10-CM

## 2011-09-13 DIAGNOSIS — R509 Fever, unspecified: Secondary | ICD-10-CM

## 2011-09-13 DIAGNOSIS — Z79899 Other long term (current) drug therapy: Secondary | ICD-10-CM | POA: Insufficient documentation

## 2011-09-13 DIAGNOSIS — N189 Chronic kidney disease, unspecified: Secondary | ICD-10-CM | POA: Insufficient documentation

## 2011-09-13 DIAGNOSIS — J40 Bronchitis, not specified as acute or chronic: Secondary | ICD-10-CM | POA: Insufficient documentation

## 2011-09-13 DIAGNOSIS — J45909 Unspecified asthma, uncomplicated: Secondary | ICD-10-CM | POA: Insufficient documentation

## 2011-09-13 HISTORY — DX: Morbid (severe) obesity due to excess calories: E66.01

## 2011-09-13 HISTORY — DX: Chronic kidney disease, unspecified: N18.9

## 2011-09-13 LAB — URINALYSIS, ROUTINE W REFLEX MICROSCOPIC
Glucose, UA: NEGATIVE mg/dL
Ketones, ur: NEGATIVE mg/dL
Leukocytes, UA: NEGATIVE
Specific Gravity, Urine: 1.016 (ref 1.005–1.030)
pH: 5 (ref 5.0–8.0)

## 2011-09-13 LAB — BASIC METABOLIC PANEL
CO2: 19 mEq/L (ref 19–32)
Calcium: 10.2 mg/dL (ref 8.4–10.5)
Creatinine, Ser: 1.5 mg/dL — ABNORMAL HIGH (ref 0.50–1.10)
GFR calc Af Amer: 44 mL/min — ABNORMAL LOW (ref >90)

## 2011-09-13 LAB — CBC
MCH: 30.8 pg (ref 26.0–34.0)
MCV: 89.7 fL (ref 78.0–100.0)
Platelets: 245 10*3/uL (ref 150–400)
RDW: 12.7 % (ref 11.5–15.5)

## 2011-09-13 LAB — PROTIME-INR: Prothrombin Time: 14.4 seconds (ref 11.6–15.2)

## 2011-09-13 LAB — CARDIAC PANEL(CRET KIN+CKTOT+MB+TROPI): Total CK: 73 U/L (ref 7–177)

## 2011-09-13 LAB — URINE MICROSCOPIC-ADD ON

## 2011-09-13 MED ORDER — OXYCODONE-ACETAMINOPHEN 5-325 MG PO TABS
1.0000 | ORAL_TABLET | Freq: Once | ORAL | Status: DC
  Filled 2011-09-13: qty 1

## 2011-09-13 MED ORDER — AZITHROMYCIN 250 MG PO TABS: 250.0000 mg | ORAL_TABLET | Freq: Every day | ORAL | Status: AC

## 2011-09-13 MED ORDER — ALBUTEROL SULFATE (5 MG/ML) 0.5% IN NEBU
5.0000 mg | INHALATION_SOLUTION | Freq: Once | RESPIRATORY_TRACT | Status: AC
  Administered 2011-09-13: 5 mg via RESPIRATORY_TRACT
  Filled 2011-09-13: qty 1

## 2011-09-13 MED ORDER — SODIUM CHLORIDE 0.9 % IV BOLUS (SEPSIS)
500.0000 mL | Freq: Once | INTRAVENOUS | Status: AC
  Administered 2011-09-13: 14:00:00 via INTRAVENOUS

## 2011-09-13 MED ORDER — IPRATROPIUM BROMIDE 0.02 % IN SOLN
0.5000 mg | Freq: Once | RESPIRATORY_TRACT | Status: AC
  Administered 2011-09-13: 0.5 mg via RESPIRATORY_TRACT
  Filled 2011-09-13: qty 2.5

## 2011-09-13 MED ORDER — ALBUTEROL SULFATE (2.5 MG/3ML) 0.083% IN NEBU: 2.5000 mg | INHALATION_SOLUTION | Freq: Four times a day (QID) | RESPIRATORY_TRACT | Status: AC | PRN

## 2011-09-13 NOTE — ED Notes (Signed)
Patient requested to take her home pain meds.  Update to EDP, orders received, patient has taken her own meds upon returning to room.

## 2011-09-13 NOTE — ED Provider Notes (Addendum)
History     CSN: [id number] Arrival date & time: 09/13/2011  9:59 AM   First MD Initiated Contact with Patient 09/13/11 1009      Chief Complaint  Patient presents with  . Cough    Productive cough runny nose fever    (Consider location/radiation/quality/duration/timing/severity/associated sxs/prior treatment) Patient is a 60 y.o. female presenting with cough. The history is provided by the patient. No language interpreter was used.  Cough The current episode started more than 2 days ago (4 days ago). The problem occurs constantly. The problem has been gradually worsening. The cough is non-productive (feels like she has sputum but can't get it up). The maximum temperature recorded prior to her arrival was 101 to 101.9 F. The fever has been present for less than 1 day. Associated symptoms include rhinorrhea, sore throat and shortness of breath. Pertinent negatives include no chest pain, no chills, no sweats, no weight loss, no ear congestion, no ear pain, no headaches, no myalgias, no wheezing and no eye redness. She has tried decongestants (Augmentin) for the symptoms. The treatment provided no relief. Risk factors: knee surgery 3 weeks ago. She is not a smoker. Her past medical history is significant for asthma. Her past medical history does not include bronchitis, pneumonia, bronchiectasis, COPD or emphysema.  Children and adults in the home with similar symptoms on antibiotics without improvement.    Past Medical History  Diagnosis Date  . Asthma   . Morbid obesity   . Chronic kidney disease     Past Surgical History  Procedure Date  . Total knee arthroplasty 2012    partial knee replacement  . Total hip arthroplasty     right  . Rotator cuff repair     No family history on file.  History  Substance Use Topics  . Smoking status: Never Smoker   . Smokeless tobacco: Not on file  . Alcohol Use: No    OB History    Grav Para Term Preterm Abortions TAB SAB Ect Mult Living                   Review of Systems  Constitutional: Negative for chills and weight loss.  HENT: Positive for sore throat and rhinorrhea. Negative for ear pain.   Eyes: Negative for discharge and redness.  Respiratory: Positive for cough and shortness of breath. Negative for chest tightness and wheezing.   Cardiovascular: Negative for chest pain.  Genitourinary: Positive for dysuria. Negative for flank pain and difficulty urinating.  Musculoskeletal: Negative.  Negative for myalgias.  Skin: Negative for color change.  Neurological: Negative for headaches.  Hematological: Negative.   Psychiatric/Behavioral: Negative.     Allergies  Ivp dye; Rocephin; and Paper mulberry fruit  Home Medications   Current Outpatient Rx  Name Route Sig Dispense Refill  . AMOXICILLIN 875 MG PO TABS Oral Take 875 mg by mouth 2 (two) times daily. For 14 days started on 09-01-11     . BETAMETHASONE VALERATE 0.1 % EX OINT Topical Apply 1 application topically 2 (two) times daily. For psoris     . BUPROPION HCL ER (SR) 150 MG PO TB12 Oral Take 150 mg by mouth 2 (two) times daily.      Marland Kitchen DOCUSATE SODIUM 100 MG PO CAPS Oral Take 100 mg by mouth 2 (two) times daily.     Marland Kitchen FERROUS SULFATE 325 (65 FE) MG PO TABS Oral Take 325 mg by mouth 3 (three) times daily. For 2 weeks    .  FUROSEMIDE 20 MG PO TABS Oral Take 40 mg by mouth daily. For fluid retention     . HYDROCODONE-ACETAMINOPHEN 7.5-325 MG PO TABS Oral Take 1 tablet by mouth every 6 (six) hours as needed. For pain     . METHOCARBAMOL 500 MG PO TABS Oral Take 500 mg by mouth 4 (four) times daily as needed. For muscle spasm    . MONTELUKAST SODIUM 10 MG PO TABS Oral Take 10 mg by mouth at bedtime.      Marland Kitchen POLYETHYLENE GLYCOL 3350 PO POWD Oral Take 17 g by mouth 2 (two) times daily. For constipation     . RAMIPRIL 5 MG PO CAPS Oral Take 5 mg by mouth 2 (two) times daily. Take 1 cap in the morning 3 in the evening     . RIVAROXABAN 10 MG PO TABS Oral Take 10 mg  by mouth daily.        BP 149/80  Pulse 100  Temp(Src) 99 F (37.2 C) (Oral)  Ht 5\' 4"  (1.626 m)  Wt 255 lb (115.667 kg)  BMI 43.77 kg/m2  SpO2 100%  Physical Exam  Constitutional: She is oriented to person, place, and time. She appears well-developed and well-nourished. No distress.  HENT:  Head: Normocephalic and atraumatic.  Right Ear: Hearing and tympanic membrane normal.  Left Ear: Hearing and tympanic membrane normal.  Eyes: EOM are normal. Pupils are equal, round, and reactive to light. Right eye exhibits no discharge. Left eye exhibits no discharge.  Neck: Normal range of motion. Neck supple. No JVD present.  Cardiovascular: Normal rate and regular rhythm.  Exam reveals no friction rub.   Pulmonary/Chest: Effort normal. She has wheezes. She has no rales. She exhibits no tenderness.  Abdominal: Soft. Bowel sounds are normal. There is no tenderness. There is no rebound and no guarding.  Musculoskeletal: Normal range of motion. She exhibits no edema.  Lymphadenopathy:    She has no cervical adenopathy.  Neurological: She is alert and oriented to person, place, and time.  Skin: Skin is warm and dry.  Psychiatric: She has a normal mood and affect.  Knee incision CDI no warmth erythema or fluctuance, no drainage no swelling  ED Course  Procedures (including critical care time)   Labs Reviewed  CBC  BASIC METABOLIC PANEL  CARDIAC PANEL(CRET KIN+CKTOT+MB+TROPI)  URINALYSIS, ROUTINE W REFLEX MICROSCOPIC  URINE CULTURE  PROTIME-INR   No results found.   No diagnosis found.    MDM  1255 case d/w Dr. Despina Hick add a Zpak to her meds.  Follow up in the office.  Follow up with PMD.  Patient informed and verbalizes understanding and agrees to follow up       Date: 09/13/2011  Rate: 108  Rhythm: sinus tachycardia  QRS Axis: normal  Intervals: normal  ST/T Wave abnormalities: normal  Conduction Disutrbances:none  Narrative Interpretation:   Old EKG Reviewed:  changes noted   Taelar Gronewold K Lajuan Godbee-Rasch, MD 09/13/11 1314  Sharetta Ricchio K Spence Soberano-Rasch, MD 09/13/11 1335

## 2011-09-13 NOTE — ED Notes (Signed)
Patient states she has had sob, productive cough and fever up to 101.2 for the last 2 days.  States she is unaware of the color of her secretions.  States she is s/p partial knee replacement 3 weeks ago.  States she has called her Orthro doctor for directions since she has been on antibiotics since her surgery and was told to come in for evaluation to r/o pnuemonia.

## 2011-09-14 LAB — URINE CULTURE
Colony Count: NO GROWTH
Culture  Setup Time: 201211131842

## 2012-03-30 ENCOUNTER — Encounter (HOSPITAL_COMMUNITY): Payer: Self-pay | Admitting: *Deleted

## 2012-03-30 ENCOUNTER — Emergency Department (HOSPITAL_COMMUNITY)
Admission: EM | Admit: 2012-03-30 | Discharge: 2012-03-30 | Disposition: A | Discharge status: Home / Self Care | Payer: 59 | Attending: Emergency Medicine | Admitting: Emergency Medicine

## 2012-03-30 DIAGNOSIS — N189 Chronic kidney disease, unspecified: Secondary | ICD-10-CM | POA: Insufficient documentation

## 2012-03-30 DIAGNOSIS — M7989 Other specified soft tissue disorders: Secondary | ICD-10-CM

## 2012-03-30 DIAGNOSIS — L02419 Cutaneous abscess of limb, unspecified: Secondary | ICD-10-CM | POA: Insufficient documentation

## 2012-03-30 DIAGNOSIS — L0390 Cellulitis, unspecified: Secondary | ICD-10-CM

## 2012-03-30 DIAGNOSIS — J45909 Unspecified asthma, uncomplicated: Secondary | ICD-10-CM | POA: Insufficient documentation

## 2012-03-30 MED ORDER — VANCOMYCIN HCL IN DEXTROSE 1-5 GM/200ML-% IV SOLN
1000.0000 mg | Freq: Once | INTRAVENOUS | Status: DC
  Filled 2012-03-30: qty 200

## 2012-03-30 MED ORDER — CEPHALEXIN 500 MG PO CAPS
500.0000 mg | ORAL_CAPSULE | Freq: Once | ORAL | Status: AC
  Administered 2012-03-30: 500 mg via ORAL
  Filled 2012-03-30: qty 1

## 2012-03-30 NOTE — ED Provider Notes (Signed)
History     CSN: [id number]  Arrival date & time 03/30/12  1245   First MD Initiated Contact with Patient 03/30/12 1438      Chief Complaint  Patient presents with  . Leg Swelling    RT leg  . Nausea  . Emesis    (Consider location/radiation/quality/duration/timing/severity/associated sxs/prior treatment) HPI Comments: Patient with history of morbid obesity, lower extremity cellulitis presents from his primary care physician's office for ultrasound to rule out DVT. Patient states that she started having redness of right lower extremity and flulike symptoms last night. She had a fever to 101F. Symptoms are similar to previous lower extremity cellulitis except she now has groin pain and nausea. Patient usually is prescribed Levaquin or Keflex which cures the symptoms. She denies shortness of breath or trouble breathing. No treatments prior. Nothing makes symptoms better or worse. Course is constant.  Patient is a 61 y.o. female presenting with leg pain. The history is provided by the patient.  Leg Pain  There was no injury mechanism. The quality of the pain is described as aching. The pain is mild. The pain has been constant since onset. Pertinent negatives include no inability to bear weight, no loss of motion and no tingling. She has tried nothing for the symptoms.    Past Medical History  Diagnosis Date  . Asthma   . Morbid obesity   . Chronic kidney disease     Past Surgical History  Procedure Date  . Total knee arthroplasty 2012    partial knee replacement  . Total hip arthroplasty     right  . Rotator cuff repair     History reviewed. No pertinent family history.  History  Substance Use Topics  . Smoking status: Never Smoker   . Smokeless tobacco: Not on file  . Alcohol Use: No    OB History    Grav Para Term Preterm Abortions TAB SAB Ect Mult Living                  Review of Systems  Constitutional: Positive for fever and chills.  HENT: Negative for  sore throat and rhinorrhea.   Eyes: Negative for redness.  Respiratory: Negative for cough and shortness of breath.   Cardiovascular: Positive for leg swelling. Negative for chest pain.  Gastrointestinal: Negative for nausea, vomiting, abdominal pain and diarrhea.  Genitourinary: Negative for dysuria.  Musculoskeletal: Positive for myalgias.  Skin: Positive for color change. Negative for wound.  Neurological: Negative for tingling and headaches.    Allergies  Ivp dye; Rocephin; and Tape  Home Medications   Current Outpatient Rx  Name Route Sig Dispense Refill  . ALBUTEROL SULFATE (2.5 MG/3ML) 0.083% IN NEBU Nebulization Take 3 mLs (2.5 mg total) by nebulization every 6 (six) hours as needed for wheezing. 75 mL 0  . AMOXICILLIN 875 MG PO TABS Oral Take 875 mg by mouth 2 (two) times daily. For 14 days started on 09-01-11     . BETAMETHASONE VALERATE 0.1 % EX OINT Topical Apply 1 application topically 2 (two) times daily. For psoris     . BUPROPION HCL ER (SR) 150 MG PO TB12 Oral Take 150 mg by mouth 2 (two) times daily.      Marland Kitchen DOCUSATE SODIUM 100 MG PO CAPS Oral Take 100 mg by mouth 2 (two) times daily.     Marland Kitchen FERROUS SULFATE 325 (65 FE) MG PO TABS Oral Take 325 mg by mouth 3 (three) times daily. For 2 weeks    .  FUROSEMIDE 20 MG PO TABS Oral Take 40 mg by mouth daily. For fluid retention     . HYDROCODONE-ACETAMINOPHEN 7.5-325 MG PO TABS Oral Take 1 tablet by mouth every 6 (six) hours as needed. For pain     . METHOCARBAMOL 500 MG PO TABS Oral Take 500 mg by mouth 4 (four) times daily as needed. For muscle spasm    . MONTELUKAST SODIUM 10 MG PO TABS Oral Take 10 mg by mouth at bedtime.      Marland Kitchen POLYETHYLENE GLYCOL 3350 PO POWD Oral Take 17 g by mouth 2 (two) times daily. For constipation     . RAMIPRIL 5 MG PO CAPS Oral Take 5 mg by mouth 2 (two) times daily. Take 1 cap in the morning 3 in the evening     . RIVAROXABAN 10 MG PO TABS Oral Take 10 mg by mouth daily.        BP 120/46   Pulse 103  Temp(Src) 99.4 F (37.4 C) (Oral)  Resp 22  SpO2 100%  Physical Exam  Nursing note and vitals reviewed. Constitutional: She appears well-developed and well-nourished.  HENT:  Head: Normocephalic and atraumatic.  Eyes: Conjunctivae are normal. Right eye exhibits no discharge. Left eye exhibits no discharge.  Neck: Normal range of motion. Neck supple. No JVD present.  Cardiovascular: Normal rate, regular rhythm and normal heart sounds.   No murmur heard. Pulmonary/Chest: Effort normal and breath sounds normal. No respiratory distress. She has no wheezes.  Abdominal: Soft. There is no tenderness.  Musculoskeletal: She exhibits edema and tenderness.       Right hip: Normal.       Right knee: She exhibits erythema. tenderness found.       Right ankle: She exhibits normal range of motion.       Legs: Neurological: She is alert.  Skin: Skin is warm and dry.  Psychiatric: She has a normal mood and affect.    ED Course  Procedures (including critical care time)  Labs Reviewed - No data to display No results found.   1. Cellulitis     2:48 PM Patient seen and examined. Work-up initiated. Medications ordered.   Vital signs reviewed and are as follows: Filed Vitals:   03/30/12 1253  BP: 120/46  Pulse: 103  Temp: 99.4 F (37.4 C)  Resp: 22   5:24 PM Neg for DVT. Patient was given option of admission, dose of IV abx in ED, d/c directly to home on oral abx. She does not want to be admitted. She agrees to dose of IV abx.   Patient was urged to return with worsening pain, swelling, persistent fever. Urged to return in 48 hours if no improvement or in 3 days with PCP if improving. Patient verbalizes understanding and agrees with plan.   6:15PM Patient has changed her mind and is requesting immediate discharge. Re-exam prior to discharge is unchanged.    MDM  Patient with h/o cellulitis, DVT is ruled-out by doppler US. She has low-grade fever, flu-like symptoms but  states this is common course for her. She has treated these symptoms at home in past. She was written her usual keflex by Dr. Juleen China. She will not agree to admission. She has good PCP f/u. Strict return and follow-up instructions given.         Renne Crigler, [place] 03/30/12 [phone]

## 2012-03-30 NOTE — Progress Notes (Signed)
*  PRELIMINARY RESULTS* Vascular Ultrasound Right lower extremity venous duplex has been completed.  Preliminary findings: Right= No evidence of DVT or baker's cyst.  Farrel Demark RDMS 03/30/2012, 3:50 PM

## 2012-03-30 NOTE — ED Notes (Signed)
Sent by PCP to r/o DVT to right leg. Redness and swelling noted. RIght leg also tender to palpation. Reports pain is worse when sitting and states " I feel a pain in my groin"

## 2012-03-30 NOTE — ED Notes (Signed)
Sent by PMD for U/S of RT leg to R/O DVT. Has pain & swelling to the RT leg since 6pm yesterday. Also c/o N/V.  Denies CP or shob.

## 2012-03-30 NOTE — Discharge Instructions (Signed)
Please read and follow all provided instructions.  Your diagnoses today include:  1. Cellulitis     Tests performed today include:  Vital signs. See below for your results today.   Medications prescribed:   None  Home care instructions:  Follow any educational materials contained in this packet. Keep affected area above the level of your heart when possible. Wash area gently twice a day with warm soapy water. Do not apply alcohol or hydrogen peroxide. Cover the area if it draining or weeping.   Follow-up instructions: Return to the Emergency Department in 48 hours for a recheck if your symptoms are not significantly improved.   Please follow-up with your primary care provider in the next 3 days regardless for further evaluation of your symptoms. If you do not have a primary care doctor -- see below for referral information.   Return instructions:  Return to the Emergency Department if you have:  Fever  Worsening symptoms  Worsening pain  Worsening swelling  Redness of the skin that moves away from the affected area, especially if it streaks away from the affected area   Any other emergent concerns  Your vital signs today were: BP 122/53  Pulse 104  Temp(Src) 100 F (37.8 C) (Oral)  Resp 22  SpO2 98% If your blood pressure (BP) was elevated above 135/85 this visit, please have this repeated by your doctor within one month. -------------- No Primary Care Doctor Call [facility]  [phone] Other agencies that provide inexpensive medical care    [facility]  [phone]    [facility]  [phone]    [facility]  [phone]    [facility]  [phone]    [facility]  [phone]    [facility]  [phone] -------------- RESOURCE GUIDE:  Dental Problems  Patients with Medicaid: [facility] [phone] [address]                                            [phone] [address]  [employer] Phone:  [phone]                                                   Phone:  [phone]  If unable to pay or uninsured, contact:  [facility] or [facility]. to become qualified for the adult dental clinic.  Chronic Pain Problems Contact [facility]  [phone] Patients need to be referred by their primary care doctor.  Insufficient Money for Medicine Contact [employer]:  call "211" or [facility] [phone].  Psychological Services [facility]  [phone] [facility]  [phone] [facility]   [phone] (emergency services [phone])  Substance Abuse Resources [facility]  [phone] [facility] [phone] [facility] [place] [phone] Floydene Flock [phone] Residential & Outpatient Substance Abuse Program  [phone]  Abuse/Neglect [facility] Child Abuse Hotline 403-539-5825 [facility] Child Abuse Hotline [phone] (After Hours)  Emergency Shelter [facility] [phone]  Maternity Homes [facility] [place] [facility] [phone]) [phone])  [phone] [employer] Services [phone]  [facility]  [facility]     [employer]                          [facility] Dept. [address] [place]                       [address]      [address]  Blondell Reveal Phone:  [phone]                                   Phone:  [phone]                 Phone:  [phone]  [facility] Phone:  [phone]  [facility] Child Abuse Hotline 650 340 [phone] (After Hours)

## 2012-03-31 NOTE — ED Provider Notes (Signed)
Medical screening examination/treatment/procedure(s) were performed by non-physician practitioner and as supervising physician I was immediately available for consultation/collaboration.  Pamella Samons T Karmina Zufall, MD 03/31/12 1509 

## 2014-10-26 ENCOUNTER — Encounter (HOSPITAL_BASED_OUTPATIENT_CLINIC_OR_DEPARTMENT_OTHER): Payer: Self-pay | Admitting: Emergency Medicine

## 2014-10-26 ENCOUNTER — Emergency Department (HOSPITAL_BASED_OUTPATIENT_CLINIC_OR_DEPARTMENT_OTHER): Payer: 59

## 2014-10-26 ENCOUNTER — Emergency Department (HOSPITAL_BASED_OUTPATIENT_CLINIC_OR_DEPARTMENT_OTHER)
Admission: EM | Admit: 2014-10-26 | Discharge: 2014-10-26 | Disposition: A | Discharge status: Home / Self Care | Payer: 59 | Attending: Emergency Medicine | Admitting: Emergency Medicine

## 2014-10-26 DIAGNOSIS — J45909 Unspecified asthma, uncomplicated: Secondary | ICD-10-CM | POA: Insufficient documentation

## 2014-10-26 DIAGNOSIS — M654 Radial styloid tenosynovitis [de Quervain]: Secondary | ICD-10-CM

## 2014-10-26 DIAGNOSIS — M79641 Pain in right hand: Secondary | ICD-10-CM

## 2014-10-26 DIAGNOSIS — M65831 Other synovitis and tenosynovitis, right forearm: Secondary | ICD-10-CM | POA: Insufficient documentation

## 2014-10-26 DIAGNOSIS — Z79899 Other long term (current) drug therapy: Secondary | ICD-10-CM | POA: Insufficient documentation

## 2014-10-26 DIAGNOSIS — Z7901 Long term (current) use of anticoagulants: Secondary | ICD-10-CM | POA: Insufficient documentation

## 2014-10-26 DIAGNOSIS — N189 Chronic kidney disease, unspecified: Secondary | ICD-10-CM | POA: Insufficient documentation

## 2014-10-26 DIAGNOSIS — Z7952 Long term (current) use of systemic steroids: Secondary | ICD-10-CM | POA: Insufficient documentation

## 2014-10-26 MED ORDER — PREDNISONE 20 MG PO TABS: 40.0000 mg | ORAL_TABLET | Freq: Every day | ORAL | Status: DC

## 2014-10-26 NOTE — ED Provider Notes (Signed)
CSN: [id number]     Arrival date & time 10/26/14  1101 History   First MD Initiated Contact with Patient 10/26/14 1112     Chief Complaint  Patient presents with  . Arm Pain     (Consider location/radiation/quality/duration/timing/severity/associated sxs/prior Treatment) HPI Comments: Patient presents to the ER for evaluation of pain and swelling at the base of the right thumb since she woke up yesterday. She denies any known injury. Ports that she has noticed decreased range of motion of the thumb because of pain. She took ibuprofen this morning it seems to help a little bit.  Patient is a 63 y.o. female presenting with arm pain.  Arm Pain    Past Medical History  Diagnosis Date  . Asthma   . Morbid obesity   . Chronic kidney disease    Past Surgical History  Procedure Laterality Date  . Total knee arthroplasty  2012    partial knee replacement  . Total hip arthroplasty      right  . Rotator cuff repair     No family history on file. History  Substance Use Topics  . Smoking status: Never Smoker   . Smokeless tobacco: Not on file  . Alcohol Use: No   OB History    No data available     Review of Systems  Musculoskeletal: Positive for arthralgias.  All other systems reviewed and are negative.     Allergies  Ivp dye; Rocephin; and Tape  Home Medications   Prior to Admission medications   Medication Sig Start Date End Date Taking? Authorizing Provider  albuterol (PROVENTIL) (2.5 MG/3ML) 0.083% nebulizer solution Take 3 mLs (2.5 mg total) by nebulization every 6 (six) hours as needed for wheezing. 09/13/11 09/12/12  April K Palumbo-Rasch, MD  amoxicillin (AMOXIL) 875 MG tablet Take 875 mg by mouth 2 (two) times daily. For 14 days started on 09-01-11     Historical Provider, MD  betamethasone valerate (VALISONE) 0.1 % ointment Apply 1 application topically 2 (two) times daily. For psoris     Historical Provider, MD  buPROPion (WELLBUTRIN SR) 150 MG 12 hr tablet  Take 150 mg by mouth 2 (two) times daily.      Historical Provider, MD  docusate sodium (COLACE) 100 MG capsule Take 100 mg by mouth 2 (two) times daily.     Historical Provider, MD  ferrous sulfate 325 (65 FE) MG tablet Take 325 mg by mouth 3 (three) times daily. For 2 weeks    Historical Provider, MD  furosemide (LASIX) 20 MG tablet Take 40 mg by mouth daily. For fluid retention     Historical Provider, MD  HYDROcodone-acetaminophen (NORCO) 7.5-325 MG per tablet Take 1 tablet by mouth every 6 (six) hours as needed. For pain     Historical Provider, MD  methocarbamol (ROBAXIN) 500 MG tablet Take 500 mg by mouth 4 (four) times daily as needed. For muscle spasm    Historical Provider, MD  montelukast (SINGULAIR) 10 MG tablet Take 10 mg by mouth at bedtime.      Historical Provider, MD  polyethylene glycol powder (MIRALAX) powder Take 17 g by mouth 2 (two) times daily. For constipation     Historical Provider, MD  ramipril (ALTACE) 5 MG capsule Take 5 mg by mouth 2 (two) times daily. Take 1 cap in the morning 3 in the evening     Historical Provider, MD  rivaroxaban (XARELTO) 10 MG TABS tablet Take 10 mg by mouth daily.  Historical Provider, MD   BP 158/77 mmHg  Pulse 86  Temp(Src) 98.2 F (36.8 C) (Oral)  Resp 18  SpO2 100% Physical Exam  Cardiovascular:  Pulses:      Radial pulses are 1+ on the right side.  Musculoskeletal:       Right wrist: She exhibits no tenderness.       Right hand: She exhibits decreased range of motion (thumb secondary to pain) and tenderness. She exhibits no deformity and no swelling.       Hands: Positive Finkelstein  Skin: Skin is warm, dry and intact. No ecchymosis and no rash noted. No erythema.    ED Course  Procedures (including critical care time) Labs Review Labs Reviewed - No data to display  Imaging Review No results found.   EKG Interpretation None      MDM   Final diagnoses:  Hand pain, right   de Quervain's tenosynovitis  She  presents to the ER for evaluation of pain in the right thenar eminence and base of thumb region. She has not had any injury. There is tenderness over the radial aspect of the thenar eminence and patient has significant pain with flexing the thumb and ulnar deviation of the wrist. This is consistent with decreased pain status and arthritis. X-ray performed, negative. Patient treated with analgesia, ibuprofen, thumb spica.    Gilda Creasehristopher J. Pollina, MD 10/26/14 1153

## 2014-10-26 NOTE — ED Notes (Signed)
PT presents to ED with complaints of right arm pain and swelling since she woke yesterday without injury.

## 2014-10-26 NOTE — Discharge Instructions (Signed)
De Quervain's Disease De Quervain's disease is a condition often seen in racquet sports where there is a soreness (inflammation) in the cord like structures (tendons) which attach muscle to bone on the thumb side of the wrist. There may be a tightening of the tissuesaround the tendons. This condition is often helped by giving up or modifying the activity which caused it. When conservative treatment does not help, surgery may be required. Conservative treatment could include changes in the activity which brought about the problem or made it worse. Anti-inflammatory medications and injections may be used to help decrease the inflammation and help with pain control. Your caregiver will help you determine which is best for you. DIAGNOSIS  Often the diagnosis (learning what is wrong) can be made by examination. Sometimes x-rays are required. HOME CARE INSTRUCTIONS   Apply ice to the sore area for 15-20 minutes, 03-04 times per day while awake. Put the ice in a plastic bag and place a towel between the bag of ice and your skin. This is especially helpful if it can be done after all activities involving the sore wrist.  Temporary splinting may help.  Only take over-the-counter or prescription medicines for pain, discomfort or fever as directed by your caregiver. SEEK MEDICAL CARE IF:   Pain relief is not obtained with medications, or if you have increasing pain and seem to be getting worse rather than better. MAKE SURE YOU:   Understand these instructions.  Will watch your condition.  Will get help right away if you are not doing well or get worse. Document Released: 07/12/2001 Document Revised: 01/09/2012 Document Reviewed: 02/19/2014 ExitCare Patient Information 2015 [employer]. This information is not intended to replace advice given to you by your health care provider. Make sure you discuss any questions you have with your health care provider.  

## 2014-11-18 ENCOUNTER — Other Ambulatory Visit: Payer: Self-pay | Admitting: Nephrology

## 2014-11-18 DIAGNOSIS — N185 Chronic kidney disease, stage 5: Secondary | ICD-10-CM

## 2014-11-21 ENCOUNTER — Other Ambulatory Visit: Payer: Self-pay

## 2014-11-24 ENCOUNTER — Other Ambulatory Visit: Payer: Self-pay

## 2014-11-24 DIAGNOSIS — Z01810 Encounter for preprocedural cardiovascular examination: Secondary | ICD-10-CM

## 2014-11-24 DIAGNOSIS — N184 Chronic kidney disease, stage 4 (severe): Secondary | ICD-10-CM

## 2014-11-25 ENCOUNTER — Ambulatory Visit
Admission: RE | Admit: 2014-11-25 | Discharge: 2014-11-25 | Disposition: A | Discharge status: Home / Self Care | Payer: No Typology Code available for payment source | Source: Ambulatory Visit | Attending: Nephrology | Admitting: Nephrology

## 2014-11-25 DIAGNOSIS — N185 Chronic kidney disease, stage 5: Secondary | ICD-10-CM

## 2014-12-01 ENCOUNTER — Encounter: Payer: Self-pay | Admitting: Vascular Surgery

## 2014-12-02 ENCOUNTER — Ambulatory Visit (INDEPENDENT_AMBULATORY_CARE_PROVIDER_SITE_OTHER)
Admission: RE | Admit: 2014-12-02 | Discharge: 2014-12-02 | Disposition: A | Discharge status: Home / Self Care | Payer: Medicaid Other | Source: Ambulatory Visit | Attending: Vascular Surgery | Admitting: Vascular Surgery

## 2014-12-02 ENCOUNTER — Ambulatory Visit (HOSPITAL_COMMUNITY)
Admission: RE | Admit: 2014-12-02 | Discharge: 2014-12-02 | Disposition: A | Discharge status: Home / Self Care | Payer: Medicaid Other | Source: Ambulatory Visit | Attending: Vascular Surgery | Admitting: Vascular Surgery

## 2014-12-02 ENCOUNTER — Other Ambulatory Visit: Payer: Self-pay

## 2014-12-02 ENCOUNTER — Encounter: Payer: Self-pay | Admitting: Vascular Surgery

## 2014-12-02 ENCOUNTER — Ambulatory Visit (INDEPENDENT_AMBULATORY_CARE_PROVIDER_SITE_OTHER): Payer: Medicaid Other | Admitting: Vascular Surgery

## 2014-12-02 VITALS — BP 127/73 | HR 76 | Resp 18 | Ht 64.0 in | Wt 247.8 lb

## 2014-12-02 DIAGNOSIS — N184 Chronic kidney disease, stage 4 (severe): Secondary | ICD-10-CM

## 2014-12-02 DIAGNOSIS — Z01810 Encounter for preprocedural cardiovascular examination: Secondary | ICD-10-CM

## 2014-12-02 NOTE — Progress Notes (Signed)
Patient name: Lendon ColonelCathy L Corsi MRN: [id number] DOB: 1951/05/17 Sex: female   Referred by: Deterding  Reason for referral:  Chief Complaint  Patient presents with  . Follow-up    evaluate for access referred by Dr. Darrick Pennaeterding    HISTORY OF PRESENT ILLNESS: Seen today as an urgent request. Patient has progressive renal failure and is approaching end-stage renal disease and needs to initiate dialysis this week if possible. She has never had any prior dialysis. She has issues regarding and no insurance coverage and this is being coordinated through the [facility]. She is right-handed    Past Medical History  Diagnosis Date  . Asthma   . Morbid obesity   . Chronic kidney disease   . Psoriasis   . Depression   . Fibromyalgia   . Osteoarthritis   . Hypothyroidism     Past Surgical History  Procedure Laterality Date  . Total knee arthroplasty  2012    partial knee replacement  . Total hip arthroplasty      right  . Rotator cuff repair      History   Social History  . Marital Status: Married    Spouse Name: N/A    Number of Children: N/A  . Years of Education: N/A   Occupational History  . Not on file.   Social History Main Topics  . Smoking status: Never Smoker   . Smokeless tobacco: Not on file  . Alcohol Use: No  . Drug Use: No  . Sexual Activity: Not on file   Other Topics Concern  . Not on file   Social History Narrative    History reviewed. No pertinent family history.  Allergies as of 12/02/2014 - Review Complete 12/02/2014  Allergen Reaction Noted  . Ivp dye [iodinated diagnostic agents] Anaphylaxis 09/13/2011  . Rocephin [ceftriaxone sodium in dextrose] Anaphylaxis 09/13/2011  . Tape  03/30/2012    Current Outpatient Prescriptions on File Prior to Visit  Medication Sig Dispense Refill  . buPROPion (WELLBUTRIN SR) 150 MG 12 hr tablet Take 150 mg by mouth daily.     . furosemide (LASIX) 20 MG tablet Take 40 mg by mouth 2 (two) times  daily. For fluid retention    . methocarbamol (ROBAXIN) 500 MG tablet Take 500 mg by mouth 4 (four) times daily as needed. For muscle spasm    . albuterol (PROVENTIL) (2.5 MG/3ML) 0.083% nebulizer solution Take 3 mLs (2.5 mg total) by nebulization every 6 (six) hours as needed for wheezing. 75 mL 0  . amoxicillin (AMOXIL) 875 MG tablet Take 875 mg by mouth 2 (two) times daily. For 14 days started on 09-01-11     . betamethasone valerate (VALISONE) 0.1 % ointment Apply 1 application topically 2 (two) times daily. For psoris     . docusate sodium (COLACE) 100 MG capsule Take 100 mg by mouth 2 (two) times daily.     . ferrous sulfate 325 (65 FE) MG tablet Take 325 mg by mouth 3 (three) times daily. For 2 weeks    . HYDROcodone-acetaminophen (NORCO) 7.5-325 MG per tablet Take 1 tablet by mouth every 6 (six) hours as needed. For pain     . montelukast (SINGULAIR) 10 MG tablet Take 10 mg by mouth at bedtime.      . polyethylene glycol powder (MIRALAX) powder Take 17 g by mouth 2 (two) times daily. For constipation     . predniSONE (DELTASONE) 20 MG tablet Take 2 tablets (40 mg total) by  mouth daily with breakfast. (Patient not taking: Reported on 12/02/2014) 10 tablet 0  . ramipril (ALTACE) 5 MG capsule Take 5 mg by mouth 2 (two) times daily. Take 1 cap in the morning 3 in the evening     . rivaroxaban (XARELTO) 10 MG TABS tablet Take 10 mg by mouth daily.       No current facility-administered medications on file prior to visit.     REVIEW OF SYSTEMS:  Positives indicated with an "X"  CARDIOVASCULAR:   chest pain    chest pressure    palpitations    orthopnea   [x ] dyspnea on exertion    claudication    rest pain    DVT    phlebitis PULMONARY:    productive cough   [ x] asthma    wheezing NEUROLOGIC:    weakness   paresthesias   aphasia   amaurosis  x ] dizziness HEMATOLOGIC:    bleeding problems    clotting disorders MUSCULOSKELETAL:   joint  pain    joint swelling GASTROINTESTINAL:   blood in stool    hematemesis GENITOURINARY:    dysuria    hematuria PSYCHIATRIC:   history of major depression INTEGUMENTARY:  [x ] rashes   ulcers CONSTITUTIONAL:   fever    chills  PHYSICAL EXAMINATION:  General: The patient is a well-nourished female, in no acute distress. Vital signs are BP 127/73 mmHg  Pulse 76  Resp 18  Ht  (1.626 m)  Wt 247 lb 12.8 oz (112.401 kg)  BMI 42.51 kg/m2  SpO2 99% Pulmonary: There is a good air exchange  Musculoskeletal: There are no major deformities.  There is no significant extremity pain. Neurologic: No focal weakness or paresthesias are detected, Skin: Diffuse skin psoriasis over both upper extremities Psychiatric: The patient has normal affect. Cardiovascular: 2-3+ radial pulses bilaterally   VVS Vascular Lab Studies:  Ordered and Independently Reviewed vein mapping shows small cephalic vein in her forearm bilaterally. She does have a moderate-sized cephalic vein in her left upper arm and 4 mm basilic veins bilaterally  Impression and Plan:  Had long discussion with the patient regarding accessed. I explained the option of hemodialysis catheter, AV fistula and AV Gore-Tex graft. She needs urgent dialysis and therefore will plan on a tunneled catheter. She does have very small surface veins a physical exam but does have an acceptable left antecubital vein for attempted cephalic vein fistula. We will schedule her for tunneled catheter cephalic vein fistula this week. I offered surgery for tomorrow morning and she will check with her husband regarding the ability to do this. We will be available this week to place catheter and left upper arm AV fistula at her convenience did explain the possibility of non-maturation or thrombosis of the fistula and also the lifelong need for maintenance of access.    Amanii Snethen [facility] Office:  [phone]

## 2014-12-02 NOTE — Progress Notes (Signed)
Notified by sonographer at 1200 that patient became nauseous and diaphoretic during vascular study. Upon entering room, patient is lying back on stretcher and states, "I am feeling better now. I just haven't eaten anything at all so far today." Patient's VS are as follows: BP 114/63; P 77 and O2 99% on room air. Patient's glucose 149, after drinking half bottle of apple juice. Will continue to monitor and make Domenic MorasSonya Rankin, RN aware prior to bringing patient to room to see Dr. Arbie CookeyEarly.

## 2014-12-04 ENCOUNTER — Encounter (HOSPITAL_COMMUNITY): Payer: Self-pay | Admitting: *Deleted

## 2014-12-04 MED ORDER — CHLORHEXIDINE GLUCONATE CLOTH 2 % EX PADS: 6.0000 | MEDICATED_PAD | Freq: Once | CUTANEOUS | Status: DC

## 2014-12-04 MED ORDER — SODIUM CHLORIDE 0.9 % IV SOLN: INTRAVENOUS | Status: DC

## 2014-12-04 MED ORDER — SODIUM CHLORIDE 0.9 % IV SOLN
1500.0000 mg | INTRAVENOUS | Status: AC
  Administered 2014-12-05: 1500 mg via INTRAVENOUS
  Filled 2014-12-04: qty 1500

## 2014-12-04 NOTE — Progress Notes (Signed)
   12/04/14 1841  OBSTRUCTIVE SLEEP APNEA  Have you ever been diagnosed with sleep apnea through a sleep study? No  Do you snore loudly (loud enough to be heard through closed doors)?  1  Do you often feel tired, fatigued, or sleepy during the daytime? 1  Has anyone observed you stop breathing during your sleep? 0  Do you have, or are you being treated for high blood pressure? 1  BMI more than 35 kg/m2? 1  Age over 62 years old? 1  Gender: 0  Obstructive Sleep Apnea Score 5  Score 4 or greater  Results sent to PCP

## 2014-12-04 NOTE — Progress Notes (Signed)
Mrs [name] was seen at [facility] in January of this year and reports that an EKG was done.  I faxed a request for EKG and D/C note.

## 2014-12-05 ENCOUNTER — Ambulatory Visit (HOSPITAL_COMMUNITY): Payer: Medicaid Other

## 2014-12-05 ENCOUNTER — Ambulatory Visit (HOSPITAL_COMMUNITY): Payer: Medicaid Other | Admitting: Anesthesiology

## 2014-12-05 ENCOUNTER — Other Ambulatory Visit: Payer: Self-pay | Admitting: *Deleted

## 2014-12-05 ENCOUNTER — Ambulatory Visit (HOSPITAL_COMMUNITY)
Admission: RE | Admit: 2014-12-05 | Discharge: 2014-12-05 | Disposition: A | Discharge status: Home / Self Care | Payer: Medicaid Other | Source: Ambulatory Visit | Attending: Vascular Surgery | Admitting: Vascular Surgery

## 2014-12-05 ENCOUNTER — Encounter (HOSPITAL_COMMUNITY)
Admission: RE | Disposition: A | Discharge status: Home / Self Care | Payer: Self-pay | Source: Ambulatory Visit | Attending: Vascular Surgery

## 2014-12-05 ENCOUNTER — Encounter (HOSPITAL_COMMUNITY): Payer: Self-pay | Admitting: Certified Registered Nurse Anesthetist

## 2014-12-05 ENCOUNTER — Telehealth: Payer: Self-pay | Admitting: Vascular Surgery

## 2014-12-05 DIAGNOSIS — N186 End stage renal disease: Secondary | ICD-10-CM | POA: Diagnosis present

## 2014-12-05 DIAGNOSIS — N185 Chronic kidney disease, stage 5: Secondary | ICD-10-CM | POA: Diagnosis not present

## 2014-12-05 DIAGNOSIS — M1990 Unspecified osteoarthritis, unspecified site: Secondary | ICD-10-CM | POA: Diagnosis not present

## 2014-12-05 DIAGNOSIS — F329 Major depressive disorder, single episode, unspecified: Secondary | ICD-10-CM | POA: Insufficient documentation

## 2014-12-05 DIAGNOSIS — I120 Hypertensive chronic kidney disease with stage 5 chronic kidney disease or end stage renal disease: Secondary | ICD-10-CM | POA: Diagnosis not present

## 2014-12-05 DIAGNOSIS — Z96641 Presence of right artificial hip joint: Secondary | ICD-10-CM | POA: Diagnosis not present

## 2014-12-05 DIAGNOSIS — E039 Hypothyroidism, unspecified: Secondary | ICD-10-CM | POA: Diagnosis not present

## 2014-12-05 DIAGNOSIS — Z96659 Presence of unspecified artificial knee joint: Secondary | ICD-10-CM | POA: Insufficient documentation

## 2014-12-05 DIAGNOSIS — Z7901 Long term (current) use of anticoagulants: Secondary | ICD-10-CM | POA: Insufficient documentation

## 2014-12-05 DIAGNOSIS — J45909 Unspecified asthma, uncomplicated: Secondary | ICD-10-CM | POA: Diagnosis not present

## 2014-12-05 DIAGNOSIS — Z09 Encounter for follow-up examination after completed treatment for conditions other than malignant neoplasm: Secondary | ICD-10-CM

## 2014-12-05 DIAGNOSIS — M797 Fibromyalgia: Secondary | ICD-10-CM | POA: Insufficient documentation

## 2014-12-05 DIAGNOSIS — Z4931 Encounter for adequacy testing for hemodialysis: Secondary | ICD-10-CM

## 2014-12-05 DIAGNOSIS — Z992 Dependence on renal dialysis: Secondary | ICD-10-CM

## 2014-12-05 HISTORY — PX: INSERTION OF DIALYSIS CATHETER: SHX1324

## 2014-12-05 HISTORY — DX: Reserved for inherently not codable concepts without codable children: IMO0001

## 2014-12-05 HISTORY — DX: Anemia, unspecified: D64.9

## 2014-12-05 HISTORY — PX: AV FISTULA PLACEMENT: SHX1204

## 2014-12-05 LAB — POCT I-STAT 4, (NA,K, GLUC, HGB,HCT)
Glucose, Bld: 115 mg/dL — ABNORMAL HIGH (ref 70–99)
HCT: 32 % — ABNORMAL LOW (ref 36.0–46.0)
Hemoglobin: 10.9 g/dL — ABNORMAL LOW (ref 12.0–15.0)
POTASSIUM: 4.8 mmol/L (ref 3.5–5.1)
Sodium: 139 mmol/L (ref 135–145)

## 2014-12-05 SURGERY — INSERTION OF ARTERIOVENOUS (AV) GORE-TEX GRAFT ARM
Anesthesia: Monitor Anesthesia Care | Site: Chest

## 2014-12-05 MED ORDER — FENTANYL CITRATE 0.05 MG/ML IJ SOLN: 25.0000 ug | INTRAMUSCULAR | Status: DC | PRN

## 2014-12-05 MED ORDER — OXYCODONE HCL 5 MG/5ML PO SOLN: 5.0000 mg | Freq: Once | ORAL | Status: DC | PRN

## 2014-12-05 MED ORDER — SODIUM CHLORIDE 0.9 % IV SOLN
INTRAVENOUS | Status: DC
  Administered 2014-12-05: 09:00:00 via INTRAVENOUS

## 2014-12-05 MED ORDER — HEPARIN SODIUM (PORCINE) 1000 UNIT/ML IJ SOLN
INTRAMUSCULAR | Status: AC
  Filled 2014-12-05: qty 1

## 2014-12-05 MED ORDER — FENTANYL CITRATE 0.05 MG/ML IJ SOLN
INTRAMUSCULAR | Status: DC | PRN
  Administered 2014-12-05 (×3): 50 ug via INTRAVENOUS

## 2014-12-05 MED ORDER — MIDAZOLAM HCL 5 MG/5ML IJ SOLN
INTRAMUSCULAR | Status: DC | PRN
  Administered 2014-12-05: 2 mg via INTRAVENOUS

## 2014-12-05 MED ORDER — LIDOCAINE HCL (CARDIAC) 20 MG/ML IV SOLN
INTRAVENOUS | Status: AC
  Filled 2014-12-05: qty 5

## 2014-12-05 MED ORDER — FENTANYL CITRATE 0.05 MG/ML IJ SOLN
INTRAMUSCULAR | Status: AC
  Filled 2014-12-05: qty 5

## 2014-12-05 MED ORDER — ONDANSETRON HCL 4 MG/2ML IJ SOLN
INTRAMUSCULAR | Status: DC | PRN
  Administered 2014-12-05: 4 mg via INTRAVENOUS

## 2014-12-05 MED ORDER — 0.9 % SODIUM CHLORIDE (POUR BTL) OPTIME
TOPICAL | Status: DC | PRN
  Administered 2014-12-05: 1000 mL

## 2014-12-05 MED ORDER — BUPIVACAINE-EPINEPHRINE (PF) 0.5% -1:200000 IJ SOLN
INTRAMUSCULAR | Status: AC
  Filled 2014-12-05: qty 30

## 2014-12-05 MED ORDER — PROMETHAZINE HCL 25 MG/ML IJ SOLN: 6.2500 mg | INTRAMUSCULAR | Status: DC | PRN

## 2014-12-05 MED ORDER — MIDAZOLAM HCL 2 MG/2ML IJ SOLN
INTRAMUSCULAR | Status: AC
  Filled 2014-12-05: qty 2

## 2014-12-05 MED ORDER — PROPOFOL 10 MG/ML IV BOLUS
INTRAVENOUS | Status: AC
  Filled 2014-12-05: qty 20

## 2014-12-05 MED ORDER — ONDANSETRON HCL 4 MG/2ML IJ SOLN
INTRAMUSCULAR | Status: AC
  Filled 2014-12-05: qty 2

## 2014-12-05 MED ORDER — HEPARIN SODIUM (PORCINE) 1000 UNIT/ML IJ SOLN
INTRAMUSCULAR | Status: DC | PRN
  Administered 2014-12-05: 1000 [IU]

## 2014-12-05 MED ORDER — PROPOFOL 10 MG/ML IV BOLUS
INTRAVENOUS | Status: DC | PRN
  Administered 2014-12-05: 20 mg via INTRAVENOUS

## 2014-12-05 MED ORDER — SODIUM CHLORIDE 0.9 % IR SOLN
Status: DC | PRN
  Administered 2014-12-05: 500 mL

## 2014-12-05 MED ORDER — OXYCODONE-ACETAMINOPHEN 5-325 MG PO TABS: 1.0000 | ORAL_TABLET | Freq: Four times a day (QID) | ORAL | Status: DC | PRN

## 2014-12-05 MED ORDER — PROPOFOL INFUSION 10 MG/ML OPTIME
INTRAVENOUS | Status: DC | PRN
  Administered 2014-12-05: 25 ug/kg/min via INTRAVENOUS

## 2014-12-05 MED ORDER — OXYCODONE HCL 5 MG PO TABS: 5.0000 mg | ORAL_TABLET | Freq: Once | ORAL | Status: DC | PRN

## 2014-12-05 MED ORDER — LIDOCAINE-EPINEPHRINE 0.5 %-1:200000 IJ SOLN
INTRAMUSCULAR | Status: DC | PRN
Start: 2014-12-05 — End: 2014-12-05
  Administered 2014-12-05: 30 mL

## 2014-12-05 MED ORDER — LIDOCAINE HCL (CARDIAC) 20 MG/ML IV SOLN
INTRAVENOUS | Status: DC | PRN
  Administered 2014-12-05: 100 mg via INTRAVENOUS

## 2014-12-05 SURGICAL SUPPLY — 66 items
APL SKNCLS STERI-STRIP NONHPOA (GAUZE/BANDAGES/DRESSINGS) ×2
ARMBAND PINK RESTRICT EXTREMIT (MISCELLANEOUS) ×4 IMPLANT
BAG DECANTER FOR FLEXI CONT (MISCELLANEOUS) ×4 IMPLANT
BENZOIN TINCTURE PRP APPL 2/3 (GAUZE/BANDAGES/DRESSINGS) ×4 IMPLANT
BIOPATCH RED 1 DISK 7.0 (GAUZE/BANDAGES/DRESSINGS) ×3 IMPLANT
BIOPATCH RED 1IN DISK 7.0MM (GAUZE/BANDAGES/DRESSINGS) ×1
CANISTER SUCTION 2500CC (MISCELLANEOUS) ×4 IMPLANT
CANNULA VESSEL 3MM 2 BLNT TIP (CANNULA) IMPLANT
CATH CANNON HEMO 15F 50CM (CATHETERS) IMPLANT
CATH CANNON HEMO 15FR 19 (HEMODIALYSIS SUPPLIES) IMPLANT
CATH CANNON HEMO 15FR 23CM (HEMODIALYSIS SUPPLIES) IMPLANT
CATH CANNON HEMO 15FR 31CM (HEMODIALYSIS SUPPLIES) IMPLANT
CATH CANNON HEMO 15FR 32 (HEMODIALYSIS SUPPLIES) IMPLANT
CATH CANNON HEMO 15FR 32CM (HEMODIALYSIS SUPPLIES) ×4 IMPLANT
CLIP LIGATING EXTRA MED SLVR (CLIP) ×4 IMPLANT
CLIP LIGATING EXTRA SM BLUE (MISCELLANEOUS) ×4 IMPLANT
CLOSURE WOUND 1/2 X4 (GAUZE/BANDAGES/DRESSINGS) ×1
COVER PROBE W GEL 5X96 (DRAPES) IMPLANT
COVER SURGICAL LIGHT HANDLE (MISCELLANEOUS) ×4 IMPLANT
DECANTER SPIKE VIAL GLASS SM (MISCELLANEOUS) ×4 IMPLANT
DRAPE C-ARM 42X72 X-RAY (DRAPES) ×4 IMPLANT
DRAPE CHEST BREAST 15X10 FENES (DRAPES) ×4 IMPLANT
ELECT REM PT RETURN 9FT ADLT (ELECTROSURGICAL) ×4
ELECTRODE REM PT RTRN 9FT ADLT (ELECTROSURGICAL) ×2 IMPLANT
GAUZE SPONGE 2X2 8PLY STRL LF (GAUZE/BANDAGES/DRESSINGS) ×2 IMPLANT
GAUZE SPONGE 4X4 12PLY STRL (GAUZE/BANDAGES/DRESSINGS) ×4 IMPLANT
GAUZE SPONGE 4X4 16PLY XRAY LF (GAUZE/BANDAGES/DRESSINGS) ×4 IMPLANT
GEL ULTRASOUND 20GR AQUASONIC (MISCELLANEOUS) IMPLANT
GLOVE BIOGEL PI IND STRL 6.5 (GLOVE) IMPLANT
GLOVE BIOGEL PI IND STRL 7.5 (GLOVE) IMPLANT
GLOVE BIOGEL PI INDICATOR 6.5 (GLOVE) ×8
GLOVE BIOGEL PI INDICATOR 7.5 (GLOVE) ×4
GLOVE ECLIPSE 7.0 STRL STRAW (GLOVE) ×2 IMPLANT
GLOVE SS BIOGEL STRL SZ 7.5 (GLOVE) ×2 IMPLANT
GLOVE SUPERSENSE BIOGEL SZ 7.5 (GLOVE) ×2
GOWN STRL REUS W/ TWL LRG LVL3 (GOWN DISPOSABLE) ×6 IMPLANT
GOWN STRL REUS W/TWL LRG LVL3 (GOWN DISPOSABLE) ×12
KIT BASIN OR (CUSTOM PROCEDURE TRAY) ×4 IMPLANT
KIT ROOM TURNOVER OR (KITS) ×4 IMPLANT
LIQUID BAND (GAUZE/BANDAGES/DRESSINGS) ×2 IMPLANT
NDL 18GX1X1/2 (RX/OR ONLY) (NEEDLE) ×2 IMPLANT
NDL HYPO 25GX1X1/2 BEV (NEEDLE) ×2 IMPLANT
NEEDLE 18GX1X1/2 (RX/OR ONLY) (NEEDLE) ×4 IMPLANT
NEEDLE 22X1 1/2 (OR ONLY) (NEEDLE) ×4 IMPLANT
NEEDLE HYPO 25GX1X1/2 BEV (NEEDLE) ×4 IMPLANT
NS IRRIG 1000ML POUR BTL (IV SOLUTION) ×4 IMPLANT
PACK CV ACCESS (CUSTOM PROCEDURE TRAY) ×4 IMPLANT
PACK SURGICAL SETUP 50X90 (CUSTOM PROCEDURE TRAY) ×4 IMPLANT
PAD ARMBOARD 7.5X6 YLW CONV (MISCELLANEOUS) ×8 IMPLANT
PROBE PENCIL 8 MHZ STRL DISP (MISCELLANEOUS) IMPLANT
SOAP 2 % CHG 4 OZ (WOUND CARE) ×4 IMPLANT
SPONGE GAUZE 2X2 STER 10/PKG (GAUZE/BANDAGES/DRESSINGS) ×4
STRIP CLOSURE SKIN 1/2X4 (GAUZE/BANDAGES/DRESSINGS) ×3 IMPLANT
SUT ETHILON 3 0 PS 1 (SUTURE) ×4 IMPLANT
SUT PROLENE 6 0 CC (SUTURE) ×8 IMPLANT
SUT SILK 2 0 FS (SUTURE) IMPLANT
SUT VIC AB 3-0 SH 27 (SUTURE) ×8
SUT VIC AB 3-0 SH 27X BRD (SUTURE) ×4 IMPLANT
SUT VICRYL 4-0 PS2 18IN ABS (SUTURE) ×4 IMPLANT
SYR 20CC LL (SYRINGE) ×4 IMPLANT
SYR 5ML LL (SYRINGE) ×8 IMPLANT
SYR CONTROL 10ML LL (SYRINGE) ×4 IMPLANT
SYRINGE 10CC LL (SYRINGE) ×4 IMPLANT
TAPE CLOTH SURG 4X10 WHT LF (GAUZE/BANDAGES/DRESSINGS) ×4 IMPLANT
UNDERPAD 30X30 INCONTINENT (UNDERPADS AND DIAPERS) ×4 IMPLANT
WATER STERILE IRR 1000ML POUR (IV SOLUTION) ×4 IMPLANT

## 2014-12-05 NOTE — Interval H&P Note (Signed)
History and Physical Interval Note:  12/05/2014 9:30 AM  [name]  has presented today for surgery, with the diagnosis of End Stage Renal Disease N18.6  The various methods of treatment have been discussed with the patient and family. After consideration of risks, benefits and other options for treatment, the patient has consented to  Procedure(s): INSERTION OF ARTERIOVENOUS (AV) GORE-TEX GRAFT ARM (Left) INSERTION OF DIALYSIS CATHETER (N/A) as a surgical intervention .  The patient's history has been reviewed, patient examined, no change in status, stable for surgery.  I have reviewed the patient's chart and labs.  Questions were answered to the patient's satisfaction.     [name]

## 2014-12-05 NOTE — Op Note (Signed)
    OPERATIVE REPORT  DATE OF SURGERY: 12/05/2014  PATIENT: [name] ColonelCathy L Dombeck, 64 y.o. female MRN: [id number]  DOB: 09-03-1951  PRE-OPERATIVE DIAGNOSIS: End-stage renal disease  POST-OPERATIVE DIAGNOSIS:  Same  PROCEDURE: #1 left upper arm AV fistula creation, brachiocephalic #2 right IJ hemodialysis catheter placed with ultrasound visualization  SURGEON:  Gretta Beganodd Carlitos Bottino, M.D.  PHYSICIAN ASSISTANT: Collins  ANESTHESIA:  Local with sedation  EBL: Minimal ml     BLOOD ADMINISTERED: None  DRAINS: None  SPECIMEN: None  COUNTS CORRECT:  YES  PLAN OF CARE: PACU stable   PATIENT DISPOSITION:  PACU - hemodynamically stable  PROCEDURE DETAILS: A short segment replaced supine position where the left arm for the sterile fashion. Using local anesthesia an incision was made between the level of the cephalic vein and the brachial artery at the antecubital space. The artery was of good caliber and was encircled with a vessel loop. The vein was also moderate size and curvature branches were ligated with 3 or 4000 divided. The vein was ligated distally and divided and was gently dilated with heparinized saline was felt to be adequate for fistula attempt. The brachial artery was occluded proximally and distally was opened with an 11 blade and extended longitudinally with Potts scissors. The vein was cut to appropriate length was spatulated and sewn end-to-side to the artery with a running 6-0 Prolene suture. Clamps removed and excellent thrill was noted. The wounds were irrigated with saline. Hemostasis tablet cautery. Wounds were closed with 3-0 Vicryl in the subcutaneous and subcuticular tissue. Benzoin insertion for applied. Attention was then turned to the catheter. The patient was tucked and the prepped and draped in usual sterile fashion with the neck and chest. Ultrasound visualization was used for access to the right internal jugular vein with an 18-gauge needle. A guidewire was passed centrally  and this was confirmed with fluoroscopy. A dilator and peel-away sheath was passed over the guidewire and the dilator and guidewire removed. A 27 cm hemodialysis catheter was positioned with tips in the level of the distal right atrium. The catheter was brought through subcutaneous tunnel through a separate stab incision. The 2 lm ports were attached in both lumens flushed and aspirated easily and were locked with 1000 cc heparin. The catheter was secured to the skin with a 3-0 nylon stitch and the entry site was closed with a 4 subcuticular Vicryl stitch. Sterile dressing was applied the patient was taken to the recovery room stable condition   Gretta Beganodd Parke Jandreau, M.D. 12/05/2014 11:47 AM

## 2014-12-05 NOTE — Anesthesia Postprocedure Evaluation (Signed)
  Anesthesia Post-op Note  Patient: [name]  Procedure(s) Performed: Procedure(s): INSERTION OF Left Arm  Arteriovenous Fistula  (Left) INSERTION OF DIALYSIS CATHETER (N/A)  Patient Location: PACU  Anesthesia Type:MAC  Level of Consciousness: awake and alert   Airway and Oxygen Therapy: Patient Spontanous Breathing  Post-op Pain: none  Post-op Assessment: Post-op Vital signs reviewed  Post-op Vital Signs: Reviewed  Last Vitals:  Filed Vitals:   12/05/14 1255  BP: 143/54  Pulse: 79  Temp:   Resp: 15    Complications: No apparent anesthesia complications

## 2014-12-05 NOTE — Anesthesia Preprocedure Evaluation (Addendum)
Anesthesia Evaluation  Patient identified by MRN, date of birth, ID band Patient awake    Reviewed: Allergy & Precautions, NPO status , Patient's Chart, lab work & pertinent test results  Airway Mallampati: III  TM Distance: >3 FB Neck ROM: Full    Dental   Pulmonary asthma ,  breath sounds clear to auscultation        Cardiovascular hypertension, Pt. on medications Rhythm:Regular Rate:Normal     Neuro/Psych Depression    GI/Hepatic negative GI ROS, Neg liver ROS,   Endo/Other  Hypothyroidism Morbid obesity  Renal/GU ESRFRenal disease     Musculoskeletal  (+) Arthritis -,   Abdominal   Peds  Hematology  (+) anemia ,   Anesthesia Other Findings   Reproductive/Obstetrics                            Anesthesia Physical Anesthesia Plan  ASA: III  Anesthesia Plan: MAC   Post-op Pain Management:    Induction: Intravenous  Airway Management Planned: Natural Airway and Simple Face Mask  Additional Equipment:   Intra-op Plan:   Post-operative Plan:   Informed Consent: I have reviewed the patients History and Physical, chart, labs and discussed the procedure including the risks, benefits and alternatives for the proposed anesthesia with the patient or authorized representative who has indicated his/her understanding and acceptance.   Dental advisory given  Plan Discussed with: CRNA  Anesthesia Plan Comments:        Anesthesia Quick Evaluation

## 2014-12-05 NOTE — Telephone Encounter (Addendum)
-----   Message from Sharee PimpleMarilyn K McChesney, RN sent at 12/05/2014  1:54 PM EST ----- Regarding: Schedule   ----- Message -----    From: Lars MageEmma M Collins, PA-C    Sent: 12/05/2014  11:12 AM      To: Vvs Charge Pool  Left AV fistula creation needs duplex and f/u in 6 weeks with Dr. Arbie CookeyEarly   12/05/14: spoke with patient to schedule, dpm

## 2014-12-05 NOTE — H&P (View-Only) (Signed)
   Patient name: [name] MRN: [id number] DOB: 06/14/1951 Sex: female   Referred by: Deterding  Reason for referral:  Chief Complaint  Patient presents with  . Follow-up    evaluate for access referred by Dr. Deterding    HISTORY OF PRESENT ILLNESS: Seen today as an urgent request. Patient has progressive renal failure and is approaching end-stage renal disease and needs to initiate dialysis this week if possible. She has never had any prior dialysis. She has issues regarding and no insurance coverage and this is being [facility] the [facility]. She is right-handed    Past Medical History  Diagnosis Date  . Asthma   . Morbid obesity   . Chronic kidney disease   . Psoriasis   . Depression   . Fibromyalgia   . Osteoarthritis   . Hypothyroidism     Past Surgical History  Procedure Laterality Date  . Total knee arthroplasty  2012    partial knee replacement  . Total hip arthroplasty      right  . Rotator cuff repair      History   Social History  . Marital Status: Married    Spouse Name: N/A    Number of Children: N/A  . Years of Education: N/A   Occupational History  . Not on file.   Social History Main Topics  . Smoking status: Never Smoker   . Smokeless tobacco: Not on file  . Alcohol Use: No  . Drug Use: No  . Sexual Activity: Not on file   Other Topics Concern  . Not on file   Social History Narrative    History reviewed. No pertinent family history.  Allergies as of 12/02/2014 - Review Complete 12/02/2014  Allergen Reaction Noted  . Ivp dye [iodinated diagnostic agents] Anaphylaxis 09/13/2011  . Rocephin [ceftriaxone sodium in dextrose] Anaphylaxis 09/13/2011  . Tape  03/30/2012    Current Outpatient Prescriptions on File Prior to Visit  Medication Sig Dispense Refill  . buPROPion (WELLBUTRIN SR) 150 MG 12 hr tablet Take 150 mg by mouth daily.     . furosemide (LASIX) 20 MG tablet Take 40 mg by mouth 2 (two) times  daily. For fluid retention    . methocarbamol (ROBAXIN) 500 MG tablet Take 500 mg by mouth 4 (four) times daily as needed. For muscle spasm    . albuterol (PROVENTIL) (2.5 MG/3ML) 0.083% nebulizer solution Take 3 mLs (2.5 mg total) by nebulization every 6 (six) hours as needed for wheezing. 75 mL 0  . amoxicillin (AMOXIL) 875 MG tablet Take 875 mg by mouth 2 (two) times daily. For 14 days started on 09-01-11     . betamethasone valerate (VALISONE) 0.1 % ointment Apply 1 application topically 2 (two) times daily. For psoris     . docusate sodium (COLACE) 100 MG capsule Take 100 mg by mouth 2 (two) times daily.     . ferrous sulfate 325 (65 FE) MG tablet Take 325 mg by mouth 3 (three) times daily. For 2 weeks    . HYDROcodone-acetaminophen (NORCO) 7.5-325 MG per tablet Take 1 tablet by mouth every 6 (six) hours as needed. For pain     . montelukast (SINGULAIR) 10 MG tablet Take 10 mg by mouth at bedtime.      . polyethylene glycol powder (MIRALAX) powder Take 17 g by mouth 2 (two) times daily. For constipation     . predniSONE (DELTASONE) 20 MG tablet Take 2 tablets (40 mg total) by   mouth daily with breakfast. (Patient not taking: Reported on 12/02/2014) 10 tablet 0  . ramipril (ALTACE) 5 MG capsule Take 5 mg by mouth 2 (two) times daily. Take 1 cap in the morning 3 in the evening     . rivaroxaban (XARELTO) 10 MG TABS tablet Take 10 mg by mouth daily.       No current facility-administered medications on file prior to visit.     REVIEW OF SYSTEMS:  Positives indicated with an "X"  CARDIOVASCULAR:  [ ] chest pain   [ ] chest pressure   [ ] palpitations   [ ] orthopnea   [x ] dyspnea on exertion   [ ] claudication   [ ] rest pain   [ ] DVT   [ ] phlebitis PULMONARY:   [ ] productive cough   [ x] asthma   [ ] wheezing NEUROLOGIC:   [ ] weakness  [ ] paresthesias  [ ] aphasia  [ ] amaurosis  x ] dizziness HEMATOLOGIC:   [ ] bleeding problems   [ ] clotting disorders MUSCULOSKELETAL:  [ ] joint  pain   [ ] joint swelling GASTROINTESTINAL: [ ]  blood in stool  [ ]  hematemesis GENITOURINARY:  [ ]  dysuria  [ ]  hematuria PSYCHIATRIC:  [ ] history of major depression INTEGUMENTARY:  [x ] rashes  [ ] ulcers CONSTITUTIONAL:  [ ] fever   [ ] chills  PHYSICAL EXAMINATION:  General: The patient is a well-nourished female, in no acute distress. Vital signs are BP 127/73 mmHg  Pulse 76  Resp 18  Ht 5' 4" (1.626 m)  Wt 247 lb 12.8 oz (112.401 kg)  BMI 42.51 kg/m2  SpO2 99% Pulmonary: There is a good air exchange  Musculoskeletal: There are no major deformities.  There is no significant extremity pain. Neurologic: No focal weakness or paresthesias are detected, Skin: Diffuse skin psoriasis over both upper extremities Psychiatric: The patient has normal affect. Cardiovascular: 2-3+ radial pulses bilaterally   [facility] Vascular Lab Studies:  Ordered and Independently Reviewed vein mapping shows small cephalic vein in her forearm bilaterally. She does have a moderate-sized cephalic vein in her left upper arm and 4 mm basilic veins bilaterally  Impression and Plan:  Had long discussion with the patient regarding accessed. I explained the option of hemodialysis catheter, AV fistula and AV Gore-Tex graft. She needs urgent dialysis and therefore will plan on a tunneled catheter. She does have very small surface veins a physical exam but does have an acceptable left antecubital vein for attempted cephalic vein fistula. We will schedule her for tunneled catheter cephalic vein fistula this week. I offered surgery for tomorrow morning and she will check with her husband regarding the ability to do this. We will be available this week to place catheter and left upper arm AV fistula at her convenience did explain the possibility of non-maturation or thrombosis of the fistula and also the lifelong need for maintenance of access.    Logan Vegh [facility] Office:  [phone]         

## 2014-12-05 NOTE — Transfer of Care (Signed)
Immediate Anesthesia Transfer of Care Note  Patient: [name]  Procedure(s) Performed: Procedure(s): INSERTION OF Left Arm  Arteriovenous Fistula  (Left) INSERTION OF DIALYSIS CATHETER (N/A)  Patient Location: PACU  Anesthesia Type:MAC  Level of Consciousness: awake, alert , oriented and patient cooperative  Airway & Oxygen Therapy: Patient Spontanous Breathing  Post-op Assessment: Report given to RN, Post -op Vital signs reviewed and stable and Patient moving all extremities  Post vital signs: Reviewed and stable  Last Vitals:  Filed Vitals:   12/05/14 0832  BP:   Pulse:   Temp: 36.5 C  Resp:     Complications: No apparent anesthesia complications

## 2014-12-05 NOTE — Discharge Instructions (Signed)
°What to eat: ° °For your first meals, you should eat lightly; only small meals initially.  If you do not have nausea, you may eat larger meals.  Avoid spicy, greasy and heavy food.   ° °General Anesthesia, Adult, Care After  °Refer to this sheet in the next few weeks. These instructions provide you with information on caring for yourself after your procedure. Your health care provider may also give you more specific instructions. Your treatment has been planned according to current medical practices, but problems sometimes occur. Call your health care provider if you have any problems or questions after your procedure.  °WHAT TO EXPECT AFTER THE PROCEDURE  °After the procedure, it is typical to experience:  °Sleepiness.  °Nausea and vomiting. °HOME CARE INSTRUCTIONS  °For the first 24 hours after general anesthesia:  °Have a responsible person with you.  °Do not drive a car. If you are alone, do not take public transportation.  °Do not drink alcohol.  °Do not take medicine that has not been prescribed by your health care provider.  °Do not sign important papers or make important decisions.  °You may resume a normal diet and activities as directed by your health care provider.  °Change bandages (dressings) as directed.  °If you have questions or problems that seem related to general anesthesia, call the hospital and ask for the anesthetist or anesthesiologist on call. °SEEK MEDICAL CARE IF:  °You have nausea and vomiting that continue the day after anesthesia.  °You develop a rash. °SEEK IMMEDIATE MEDICAL CARE IF:  °You have difficulty breathing.  °You have chest pain.  °You have any allergic problems. °Document Released: 01/23/2001 Document Revised: 06/19/2013 Document Reviewed: 05/02/2013  °ExitCare® Patient Information ©2014 [employer].  ° °Sore Throat  ° ° °A sore throat is a painful, burning, sore, or scratchy feeling of the throat. There may be pain or tenderness when swallowing or talking. You may have  other symptoms with a sore throat. These include coughing, sneezing, fever, or a swollen neck. A sore throat is often the first sign of another sickness. These sicknesses may include a cold, flu, strep throat, or an infection called mono. Most sore throats go away without medical treatment.  °HOME CARE  °Only take medicine as told by your doctor.  °Drink enough fluids to keep your pee (urine) clear or pale yellow.  °Rest as needed.  °Try using throat sprays, lozenges, or suck on hard candy (if older than 4 years or as told).  °Sip warm liquids, such as broth, herbal tea, or warm water with honey. Try sucking on frozen ice pops or drinking cold liquids.  °Rinse the mouth (gargle) with salt water. Mix 1 teaspoon salt with 8 ounces of water.  °Do not smoke. Avoid being around others when they are smoking.  °Put a humidifier in your bedroom at night to moisten the air. You can also turn on a hot shower and sit in the bathroom for 5-10 minutes. Be sure the bathroom door is closed. °GET HELP RIGHT AWAY IF:  °You have trouble breathing.  °You cannot swallow fluids, soft foods, or your spit (saliva).  °You have more puffiness (swelling) in the throat.  °Your sore throat does not get better in 7 days.  °You feel sick to your stomach (nauseous) and throw up (vomit).  °You have a fever or lasting symptoms for more than 2-3 days.  °You have a fever and your symptoms suddenly get worse. °MAKE SURE YOU:  °Understand these   instructions.  °Will watch your condition.  °Will get help right away if you are not doing well or get worse. °Document Released: 07/26/2008 Document Revised: 07/11/2012 Document Reviewed: 06/24/2012  °ExitCare® Patient Information ©2015 [employer]. This information is not intended to replace advice given to you by your health care provider. Make sure you discuss any questions you have with your health care provider.  ° ° ° °

## 2014-12-08 ENCOUNTER — Encounter (HOSPITAL_COMMUNITY): Payer: Self-pay | Admitting: Vascular Surgery

## 2014-12-10 ENCOUNTER — Encounter (HOSPITAL_COMMUNITY): Payer: Self-pay

## 2014-12-10 ENCOUNTER — Other Ambulatory Visit (HOSPITAL_COMMUNITY): Payer: Self-pay

## 2014-12-10 ENCOUNTER — Ambulatory Visit: Payer: Self-pay | Admitting: Vascular Surgery

## 2014-12-16 DIAGNOSIS — Z0279 Encounter for issue of other medical certificate: Secondary | ICD-10-CM

## 2014-12-19 DIAGNOSIS — Z0279 Encounter for issue of other medical certificate: Secondary | ICD-10-CM | POA: Diagnosis not present

## 2015-01-13 ENCOUNTER — Other Ambulatory Visit (HOSPITAL_COMMUNITY): Payer: No Typology Code available for payment source

## 2015-01-13 ENCOUNTER — Encounter: Payer: No Typology Code available for payment source | Admitting: Vascular Surgery

## 2015-01-16 ENCOUNTER — Ambulatory Visit (HOSPITAL_COMMUNITY)
Admission: RE | Admit: 2015-01-16 | Discharge: 2015-01-16 | Disposition: A | Discharge status: Home / Self Care | Payer: Medicaid Other | Source: Ambulatory Visit | Attending: Vascular Surgery | Admitting: Vascular Surgery

## 2015-01-16 DIAGNOSIS — Z4931 Encounter for adequacy testing for hemodialysis: Secondary | ICD-10-CM | POA: Diagnosis present

## 2015-01-16 DIAGNOSIS — N186 End stage renal disease: Secondary | ICD-10-CM | POA: Diagnosis not present

## 2015-01-19 ENCOUNTER — Encounter: Payer: Self-pay | Admitting: Vascular Surgery

## 2015-01-20 ENCOUNTER — Other Ambulatory Visit: Payer: Self-pay

## 2015-01-20 ENCOUNTER — Encounter: Payer: Self-pay | Admitting: Vascular Surgery

## 2015-01-20 ENCOUNTER — Ambulatory Visit (INDEPENDENT_AMBULATORY_CARE_PROVIDER_SITE_OTHER): Payer: Self-pay | Admitting: Vascular Surgery

## 2015-01-20 VITALS — BP 141/82 | HR 76 | Ht 64.0 in | Wt 238.0 lb

## 2015-01-20 DIAGNOSIS — N186 End stage renal disease: Secondary | ICD-10-CM

## 2015-01-20 NOTE — Progress Notes (Signed)
Here today for follow-up of left upper arm AV fistula creation by myself on 12/05/2014. She dialyzed via a tunneled IJ catheter.  Her antecubital incision is healing quite nicely. She does have a venous duplex follow-up of this fistula which I reviewed with her from 01/16/2015. This shows very good size maturation throughout its course. Her vein does run up to 1.3 cm below the surface skin. She is obese.  I discussed options with the patient. Explained that she has had good Kery Haltiwanger maturation of size. I also explained that the vein is too deep for reliable access. I have recommended second surgery for superficial mobilization of her vein. She dialyzes on Tuesday Thursday and Saturday we will coordinate this as an outpatient at her earliest convenience

## 2015-01-22 ENCOUNTER — Encounter (HOSPITAL_COMMUNITY): Payer: Self-pay | Admitting: Pharmacy Technician

## 2015-01-23 ENCOUNTER — Encounter (HOSPITAL_COMMUNITY): Payer: Self-pay | Admitting: *Deleted

## 2015-01-23 NOTE — Progress Notes (Signed)
Pt denies SOB, chest pain, and being under the care of a cardiologist. Pt denies having a stress test, echo and cardiac cath. 

## 2015-01-25 MED ORDER — SODIUM CHLORIDE 0.9 % IV SOLN
INTRAVENOUS | Status: DC
  Administered 2015-01-26: 07:00:00 via INTRAVENOUS

## 2015-01-25 MED ORDER — VANCOMYCIN HCL 10 G IV SOLR
1500.0000 mg | INTRAVENOUS | Status: DC
Start: 2015-01-25 — End: 2015-01-26
  Filled 2015-01-25: qty 1500

## 2015-01-25 NOTE — Anesthesia Preprocedure Evaluation (Signed)
Anesthesia Evaluation  Patient identified by MRN, date of birth, ID band Patient awake    Reviewed: Allergy & Precautions, NPO status , Patient's Chart, lab work & pertinent test results  Airway Mallampati: III  TM Distance: >3 FB Neck ROM: Full    Dental   Pulmonary shortness of breath, asthma ,  breath sounds clear to auscultation        Cardiovascular hypertension, Pt. on medications Rhythm:Regular Rate:Normal     Neuro/Psych PSYCHIATRIC DISORDERS Depression    GI/Hepatic negative GI ROS, Neg liver ROS,   Endo/Other  Hypothyroidism Morbid obesity  Renal/GU ESRFRenal disease     Musculoskeletal  (+) Arthritis -, Fibromyalgia -  Abdominal (+) + obese,   Peds  Hematology negative hematology ROS (+) anemia ,   Anesthesia Other Findings   Reproductive/Obstetrics negative OB ROS                             Anesthesia Physical  Anesthesia Plan  ASA: III  Anesthesia Plan: General   Post-op Pain Management:    Induction: Intravenous  Airway Management Planned: LMA  Additional Equipment:   Intra-op Plan:   Post-operative Plan: Extubation in OR  Informed Consent: I have reviewed the patients History and Physical, chart, labs and discussed the procedure including the risks, benefits and alternatives for the proposed anesthesia with the patient or authorized representative who has indicated his/her understanding and acceptance.   Dental advisory given  Plan Discussed with: CRNA  Anesthesia Plan Comments:         Anesthesia Quick Evaluation

## 2015-01-26 ENCOUNTER — Encounter (HOSPITAL_COMMUNITY)
Admission: RE | Disposition: A | Discharge status: Home / Self Care | Payer: Self-pay | Source: Ambulatory Visit | Attending: Vascular Surgery

## 2015-01-26 ENCOUNTER — Ambulatory Visit (HOSPITAL_COMMUNITY)
Admission: RE | Admit: 2015-01-26 | Discharge: 2015-01-26 | Disposition: A | Discharge status: Home / Self Care | Payer: Medicaid Other | Source: Ambulatory Visit | Attending: Vascular Surgery | Admitting: Vascular Surgery

## 2015-01-26 ENCOUNTER — Ambulatory Visit (HOSPITAL_COMMUNITY): Payer: Medicaid Other | Admitting: Anesthesiology

## 2015-01-26 ENCOUNTER — Encounter (HOSPITAL_COMMUNITY): Payer: Self-pay | Admitting: *Deleted

## 2015-01-26 DIAGNOSIS — J45909 Unspecified asthma, uncomplicated: Secondary | ICD-10-CM | POA: Diagnosis not present

## 2015-01-26 DIAGNOSIS — T82898A Other specified complication of vascular prosthetic devices, implants and grafts, initial encounter: Secondary | ICD-10-CM | POA: Diagnosis not present

## 2015-01-26 DIAGNOSIS — E039 Hypothyroidism, unspecified: Secondary | ICD-10-CM | POA: Diagnosis not present

## 2015-01-26 DIAGNOSIS — I120 Hypertensive chronic kidney disease with stage 5 chronic kidney disease or end stage renal disease: Secondary | ICD-10-CM | POA: Insufficient documentation

## 2015-01-26 DIAGNOSIS — M797 Fibromyalgia: Secondary | ICD-10-CM | POA: Insufficient documentation

## 2015-01-26 DIAGNOSIS — Z79899 Other long term (current) drug therapy: Secondary | ICD-10-CM | POA: Diagnosis not present

## 2015-01-26 DIAGNOSIS — M1990 Unspecified osteoarthritis, unspecified site: Secondary | ICD-10-CM | POA: Insufficient documentation

## 2015-01-26 DIAGNOSIS — N186 End stage renal disease: Secondary | ICD-10-CM | POA: Diagnosis not present

## 2015-01-26 HISTORY — PX: FISTULA SUPERFICIALIZATION: SHX6341

## 2015-01-26 LAB — POCT I-STAT 4, (NA,K, GLUC, HGB,HCT)
Glucose, Bld: 83 mg/dL (ref 70–99)
HEMATOCRIT: 37 % (ref 36.0–46.0)
Hemoglobin: 12.6 g/dL (ref 12.0–15.0)
Potassium: 4.3 mmol/L (ref 3.5–5.1)
SODIUM: 139 mmol/L (ref 135–145)

## 2015-01-26 SURGERY — FISTULA SUPERFICIALIZATION
Anesthesia: General | Site: Arm Upper | Laterality: Left

## 2015-01-26 MED ORDER — VANCOMYCIN HCL IN DEXTROSE 1-5 GM/200ML-% IV SOLN
INTRAVENOUS | Status: AC
  Filled 2015-01-26: qty 200

## 2015-01-26 MED ORDER — MEPERIDINE HCL 25 MG/ML IJ SOLN: 6.2500 mg | INTRAMUSCULAR | Status: DC | PRN

## 2015-01-26 MED ORDER — ONDANSETRON HCL 4 MG/2ML IJ SOLN
INTRAMUSCULAR | Status: AC
  Filled 2015-01-26: qty 2

## 2015-01-26 MED ORDER — GLYCOPYRROLATE 0.2 MG/ML IJ SOLN
INTRAMUSCULAR | Status: AC
  Filled 2015-01-26: qty 1

## 2015-01-26 MED ORDER — HYDROMORPHONE HCL 1 MG/ML IJ SOLN
0.2500 mg | INTRAMUSCULAR | Status: DC | PRN
  Administered 2015-01-26: 0.5 mg via INTRAVENOUS

## 2015-01-26 MED ORDER — MIDAZOLAM HCL 2 MG/2ML IJ SOLN
INTRAMUSCULAR | Status: AC
  Filled 2015-01-26: qty 2

## 2015-01-26 MED ORDER — CHLORHEXIDINE GLUCONATE CLOTH 2 % EX PADS: 6.0000 | MEDICATED_PAD | Freq: Once | CUTANEOUS | Status: DC

## 2015-01-26 MED ORDER — STERILE WATER FOR INJECTION IJ SOLN
INTRAMUSCULAR | Status: AC
  Filled 2015-01-26: qty 10

## 2015-01-26 MED ORDER — PROMETHAZINE HCL 25 MG/ML IJ SOLN
6.2500 mg | INTRAMUSCULAR | Status: DC | PRN
Start: 2015-01-26 — End: 2015-01-26

## 2015-01-26 MED ORDER — VANCOMYCIN HCL IN DEXTROSE 1-5 GM/200ML-% IV SOLN
1000.0000 mg | INTRAVENOUS | Status: AC
  Administered 2015-01-26: 1000 mg via INTRAVENOUS

## 2015-01-26 MED ORDER — NEOSTIGMINE METHYLSULFATE 10 MG/10ML IV SOLN
INTRAVENOUS | Status: AC
  Filled 2015-01-26: qty 1

## 2015-01-26 MED ORDER — ROCURONIUM BROMIDE 50 MG/5ML IV SOLN
INTRAVENOUS | Status: AC
  Filled 2015-01-26: qty 1

## 2015-01-26 MED ORDER — ONDANSETRON HCL 4 MG/2ML IJ SOLN
INTRAMUSCULAR | Status: DC | PRN
  Administered 2015-01-26: 4 mg via INTRAVENOUS

## 2015-01-26 MED ORDER — LIDOCAINE HCL (CARDIAC) 20 MG/ML IV SOLN
INTRAVENOUS | Status: AC
  Filled 2015-01-26: qty 5

## 2015-01-26 MED ORDER — EPHEDRINE SULFATE 50 MG/ML IJ SOLN
INTRAMUSCULAR | Status: AC
  Filled 2015-01-26: qty 1

## 2015-01-26 MED ORDER — 0.9 % SODIUM CHLORIDE (POUR BTL) OPTIME
TOPICAL | Status: DC | PRN
  Administered 2015-01-26: 1000 mL

## 2015-01-26 MED ORDER — TRAMADOL HCL 50 MG PO TABS: 50.0000 mg | ORAL_TABLET | Freq: Four times a day (QID) | ORAL | Status: DC | PRN

## 2015-01-26 MED ORDER — FENTANYL CITRATE 0.05 MG/ML IJ SOLN
INTRAMUSCULAR | Status: DC | PRN
  Administered 2015-01-26 (×3): 25 ug via INTRAVENOUS
  Administered 2015-01-26: 50 ug via INTRAVENOUS

## 2015-01-26 MED ORDER — FENTANYL CITRATE 0.05 MG/ML IJ SOLN
INTRAMUSCULAR | Status: AC
Start: 2015-01-26 — End: 2015-01-26
  Filled 2015-01-26: qty 5

## 2015-01-26 MED ORDER — PHENYLEPHRINE HCL 10 MG/ML IJ SOLN
INTRAMUSCULAR | Status: DC | PRN
  Administered 2015-01-26 (×2): 40 ug via INTRAVENOUS
  Administered 2015-01-26 (×2): 80 ug via INTRAVENOUS
  Administered 2015-01-26: 40 ug via INTRAVENOUS

## 2015-01-26 MED ORDER — PROPOFOL 10 MG/ML IV BOLUS
INTRAVENOUS | Status: AC
  Filled 2015-01-26: qty 20

## 2015-01-26 MED ORDER — PHENYLEPHRINE 40 MCG/ML (10ML) SYRINGE FOR IV PUSH (FOR BLOOD PRESSURE SUPPORT)
PREFILLED_SYRINGE | INTRAVENOUS | Status: AC
  Filled 2015-01-26: qty 10

## 2015-01-26 MED ORDER — LIDOCAINE HCL (CARDIAC) 20 MG/ML IV SOLN
INTRAVENOUS | Status: DC | PRN
  Administered 2015-01-26: 50 mg via INTRAVENOUS

## 2015-01-26 MED ORDER — MIDAZOLAM HCL 5 MG/5ML IJ SOLN
INTRAMUSCULAR | Status: DC | PRN
  Administered 2015-01-26 (×2): 1 mg via INTRAVENOUS

## 2015-01-26 MED ORDER — SODIUM CHLORIDE 0.9 % IJ SOLN
INTRAMUSCULAR | Status: AC
  Filled 2015-01-26: qty 10

## 2015-01-26 MED ORDER — DEXAMETHASONE SODIUM PHOSPHATE 4 MG/ML IJ SOLN
INTRAMUSCULAR | Status: DC | PRN
  Administered 2015-01-26: 4 mg via INTRAVENOUS

## 2015-01-26 MED ORDER — HYDROMORPHONE HCL 1 MG/ML IJ SOLN
INTRAMUSCULAR | Status: AC
  Filled 2015-01-26: qty 1

## 2015-01-26 MED ORDER — LIDOCAINE-EPINEPHRINE 0.5 %-1:200000 IJ SOLN
INTRAMUSCULAR | Status: AC
  Filled 2015-01-26: qty 1

## 2015-01-26 MED ORDER — PROPOFOL 10 MG/ML IV BOLUS
INTRAVENOUS | Status: DC | PRN
  Administered 2015-01-26: 130 mg via INTRAVENOUS
  Administered 2015-01-26: 50 mg via INTRAVENOUS

## 2015-01-26 MED ORDER — SUCCINYLCHOLINE CHLORIDE 20 MG/ML IJ SOLN
INTRAMUSCULAR | Status: AC
Start: 2015-01-26 — End: 2015-01-26
  Filled 2015-01-26: qty 1

## 2015-01-26 SURGICAL SUPPLY — 39 items
APL SKNCLS STERI-STRIP NONHPOA (GAUZE/BANDAGES/DRESSINGS) ×1
BANDAGE ELASTIC 6 VELCRO ST LF (GAUZE/BANDAGES/DRESSINGS) ×2 IMPLANT
BENZOIN TINCTURE PRP APPL 2/3 (GAUZE/BANDAGES/DRESSINGS) ×3 IMPLANT
BNDG GAUZE ELAST 4 BULKY (GAUZE/BANDAGES/DRESSINGS) ×2 IMPLANT
CANISTER SUCTION 2500CC (MISCELLANEOUS) ×3 IMPLANT
CANNULA VESSEL 3MM 2 BLNT TIP (CANNULA) ×2 IMPLANT
CLIP LIGATING EXTRA MED SLVR (CLIP) ×3 IMPLANT
CLIP LIGATING EXTRA SM BLUE (MISCELLANEOUS) ×3 IMPLANT
CLOSURE STERI-STRIP 1/2X4 (GAUZE/BANDAGES/DRESSINGS) ×1
CLOSURE WOUND 1/2 X4 (GAUZE/BANDAGES/DRESSINGS) ×1
CLSR STERI-STRIP ANTIMIC 1/2X4 (GAUZE/BANDAGES/DRESSINGS) ×1 IMPLANT
COVER PROBE W GEL 5X96 (DRAPES) ×3 IMPLANT
DECANTER SPIKE VIAL GLASS SM (MISCELLANEOUS) ×3 IMPLANT
ELECT REM PT RETURN 9FT ADLT (ELECTROSURGICAL) ×3
ELECTRODE REM PT RTRN 9FT ADLT (ELECTROSURGICAL) ×1 IMPLANT
GAUZE SPONGE 4X4 12PLY STRL (GAUZE/BANDAGES/DRESSINGS) ×3 IMPLANT
GEL ULTRASOUND 20GR AQUASONIC (MISCELLANEOUS) IMPLANT
GLOVE BIO SURGEON STRL SZ 6.5 (GLOVE) ×2 IMPLANT
GLOVE BIO SURGEONS STRL SZ 6.5 (GLOVE) ×2
GLOVE BIOGEL PI IND STRL 6.5 (GLOVE) IMPLANT
GLOVE BIOGEL PI IND STRL 7.0 (GLOVE) IMPLANT
GLOVE BIOGEL PI INDICATOR 6.5 (GLOVE) ×4
GLOVE BIOGEL PI INDICATOR 7.0 (GLOVE) ×2
GLOVE SS BIOGEL STRL SZ 7.5 (GLOVE) ×1 IMPLANT
GLOVE SUPERSENSE BIOGEL SZ 7.5 (GLOVE) ×2
GOWN STRL REUS W/ TWL LRG LVL3 (GOWN DISPOSABLE) ×3 IMPLANT
GOWN STRL REUS W/TWL LRG LVL3 (GOWN DISPOSABLE) ×9
KIT BASIN OR (CUSTOM PROCEDURE TRAY) ×3 IMPLANT
KIT ROOM TURNOVER OR (KITS) ×3 IMPLANT
NS IRRIG 1000ML POUR BTL (IV SOLUTION) ×3 IMPLANT
PACK CV ACCESS (CUSTOM PROCEDURE TRAY) ×3 IMPLANT
PAD ARMBOARD 7.5X6 YLW CONV (MISCELLANEOUS) ×6 IMPLANT
SPONGE LAP 18X18 X RAY DECT (DISPOSABLE) ×2 IMPLANT
STRIP CLOSURE SKIN 1/2X4 (GAUZE/BANDAGES/DRESSINGS) ×2 IMPLANT
SUT PROLENE 6 0 CC (SUTURE) ×3 IMPLANT
SUT VIC AB 3-0 SH 27 (SUTURE) ×3
SUT VIC AB 3-0 SH 27X BRD (SUTURE) ×1 IMPLANT
UNDERPAD 30X30 INCONTINENT (UNDERPADS AND DIAPERS) ×3 IMPLANT
WATER STERILE IRR 1000ML POUR (IV SOLUTION) ×3 IMPLANT

## 2015-01-26 NOTE — Discharge Instructions (Signed)
° ° °  01/26/2015 Lendon ColonelCathy L Lovin [id number] May 02, 1951  Surgeon(s): Larina Earthlyodd F Early, MD  Procedure(s): FISTULA SUPERFICIALIZATION- LEFT UPPER ARM  x Do not stick fistula for 6 weeks

## 2015-01-26 NOTE — H&P (View-Only) (Signed)
Here today for follow-up of left upper arm AV fistula creation by myself on 12/05/2014. She dialyzed via a tunneled IJ catheter.  Her antecubital incision is healing quite nicely. She does have a venous duplex follow-up of this fistula which I reviewed with her from 01/16/2015. This shows very good size maturation throughout its course. Her vein does run up to 1.3 cm below the surface skin. She is obese.  I discussed options with the patient. Explained that she has had good [name] maturation of size. I also explained that the vein is too deep for reliable access. I have recommended second surgery for superficial mobilization of her vein. She dialyzes on Tuesday Thursday and Saturday we will coordinate this as an outpatient at her earliest convenience 

## 2015-01-26 NOTE — Op Note (Signed)
    OPERATIVE REPORT  DATE OF SURGERY: 01/26/2015  PATIENT: [name], 64 y.o. female MRN: [id number]  DOB: Sep 12, 1951  PRE-OPERATIVE DIAGNOSIS: End stage renal disease  POST-OPERATIVE DIAGNOSIS:  Same  PROCEDURE: Superficial mobilization of left upper arm AV fistula, and ligation of competing branches  SURGEON:  Gretta Beganodd Nnamdi Dacus, M.D.  PHYSICIAN ASSISTANT: Samantha Rhyne PA-C  ANESTHESIA:  Gen.  EBL: Minimal ml     BLOOD ADMINISTERED: None  DRAINS: None  SPECIMEN: None  COUNTS CORRECT:  YES  PLAN OF CARE: PACU   PATIENT DISPOSITION:  PACU - hemodynamically stable  PROCEDURE DETAILS: The patient is status post left upper arm AV fistula creation approximate 4-6 weeks ago. She has had nice maturation of the cephalic vein fistula as far as size goes. She is morbidly obese and the vein runs 2 cm deep under the fat in her upper arm. I do not feel that she would have any option for access in of recommended superficial mobilization by resection of the fat overlying the vein in her upper arm.  Was placed in the supine position and the left arm was prepped and draped in usual sterile fashion. Incision was made over the vein several centimeters above the cubital space and also near the shoulder. The vein was imaged with SonoSite and several large tributary branches were marked on the skin. A large amount of fat was removed several centimeters to the medial and lateral aspect vein skin incisions and tunnel between the 2 skin incisions. The fascia overlying the vein was resected as well. The vein size itself was excellent with excellent flow through this. The patient did have several large tributary branches and these were ligated with 3-0 silk ties and divided. The wound was irrigated with saline and hemostasis was obtained with electrocautery. The skin incisions were closed with 3-0 subcuticular Vicryl suture only. Benzoin and Steri-Strips were applied to these and a Kerlix and Ace wrap  was placed with poor control of the tunnel. She was transferred to the recovery room in stable condition.   Gretta Beganodd Aireonna Bauer, M.D. 01/26/2015 9:11 AM

## 2015-01-26 NOTE — Anesthesia Procedure Notes (Signed)
Procedure Name: LMA Insertion Date/Time: 01/26/2015 7:36 AM Performed by: Adonis HousekeeperNGELL, Sarely Stracener M Pre-anesthesia Checklist: Patient identified, Emergency Drugs available, Suction available and Patient being monitored Patient Re-evaluated:Patient Re-evaluated prior to inductionOxygen Delivery Method: Circle system utilized Preoxygenation: Pre-oxygenation with 100% oxygen Intubation Type: IV induction Ventilation: Mask ventilation without difficulty LMA: LMA inserted LMA Size: 4.0 Number of attempts: 1 Placement Confirmation: positive ETCO2 and breath sounds checked- equal and bilateral Tube secured with: Tape Dental Injury: Teeth and Oropharynx as per pre-operative assessment

## 2015-01-26 NOTE — Anesthesia Postprocedure Evaluation (Signed)
Anesthesia Post Note  Patient: [name]  Procedure(s) Performed: Procedure(s) (LRB): FISTULA SUPERFICIALIZATION- LEFT UPPER ARM (Left)  Anesthesia type: General  Patient location: PACU  Post pain: Pain level controlled  Post assessment: Post-op Vital signs reviewed  Last Vitals: BP 151/84 mmHg  Pulse 86  Temp(Src) 36.9 C (Oral)  Resp 13  Wt 238 lb (107.956 kg)  SpO2 97%  Post vital signs: Reviewed  Level of consciousness: sedated  Complications: No apparent anesthesia complications

## 2015-01-26 NOTE — Interval H&P Note (Signed)
History and Physical Interval Note:  01/26/2015 6:51 AM  [name]  has presented today for surgery, with the diagnosis of End Stage Renal Disease N18.6  The various methods of treatment have been discussed with the patient and family. After consideration of risks, benefits and other options for treatment, the patient has consented to  Procedure(s): FISTULA SUPERFICIALIZATION- LEFT UPPER ARM (Left) as a surgical intervention .  The patient's history has been reviewed, patient examined, no change in status, stable for surgery.  I have reviewed the patient's chart and labs.  Questions were answered to the patient's satisfaction.     Chiquita Heckert

## 2015-01-26 NOTE — Transfer of Care (Signed)
Immediate Anesthesia Transfer of Care Note  Patient: [name]  Procedure(s) Performed: Procedure(s): FISTULA SUPERFICIALIZATION- LEFT UPPER ARM (Left)  Patient Location: PACU  Anesthesia Type:General  Level of Consciousness: awake, alert , oriented and patient cooperative  Airway & Oxygen Therapy: Patient Spontanous Breathing and Patient connected to face mask oxygen  Post-op Assessment: Report given to RN, Post -op Vital signs reviewed and stable, Patient moving all extremities and Patient moving all extremities X 4  Post vital signs: Reviewed and stable  Last Vitals:  Filed Vitals:   01/26/15 0913  BP:   Pulse:   Temp: 36.6 C  Resp:     Complications: No apparent anesthesia complications

## 2015-01-27 ENCOUNTER — Encounter (HOSPITAL_COMMUNITY): Payer: Self-pay | Admitting: Vascular Surgery

## 2015-02-23 ENCOUNTER — Encounter: Payer: Self-pay | Admitting: Vascular Surgery

## 2015-02-24 ENCOUNTER — Encounter: Payer: Self-pay | Admitting: Vascular Surgery

## 2015-03-06 ENCOUNTER — Encounter: Payer: Self-pay | Admitting: Vascular Surgery

## 2015-03-10 ENCOUNTER — Ambulatory Visit (INDEPENDENT_AMBULATORY_CARE_PROVIDER_SITE_OTHER): Payer: Self-pay | Admitting: Vascular Surgery

## 2015-03-10 ENCOUNTER — Encounter: Payer: Self-pay | Admitting: Vascular Surgery

## 2015-03-10 VITALS — BP 154/89 | HR 77 | Resp 16 | Ht 64.0 in | Wt 235.0 lb

## 2015-03-10 DIAGNOSIS — N186 End stage renal disease: Secondary | ICD-10-CM

## 2015-03-10 NOTE — Progress Notes (Signed)
Patient presents today for follow-up of superficial mobilization of her left upper arm AV fistula. This was done on 01/26/2015. She had a left upper arm fistula created by myself on 12/05/2014. She continues her catheter without any difficulty whatsoever.  On physical exam her incisions are all well-healed. She does have some thickening over the fact resection sites mostly in the area of the mid upper arm incision. I does have very nice size maturation and the vein is close to the surface.  Impression and plan successful fistula creation and superficial mobilization. I would recommend an additional 2 weeks to allow for the inflammation around the incision site to resolve. If she had failure of her catheter would feel comfortable with access now. Will see us again on as-needed basis.

## 2015-03-10 NOTE — Progress Notes (Signed)
Filed Vitals:   03/10/15 0907 03/10/15 0911  BP: 168/93 154/89  Pulse: 78 77  Resp: 16   Height: 5\' 4"  (1.626 m)   Weight: 235 lb (106.595 kg)    Body mass index is 40.32 kg/(m^2).

## 2015-06-22 ENCOUNTER — Other Ambulatory Visit: Payer: Self-pay | Admitting: Obstetrics and Gynecology

## 2015-06-24 LAB — CYTOLOGY - PAP

## 2015-10-27 ENCOUNTER — Emergency Department (HOSPITAL_BASED_OUTPATIENT_CLINIC_OR_DEPARTMENT_OTHER): Payer: Medicare Other

## 2015-10-27 ENCOUNTER — Encounter (HOSPITAL_BASED_OUTPATIENT_CLINIC_OR_DEPARTMENT_OTHER): Payer: Self-pay | Admitting: *Deleted

## 2015-10-27 ENCOUNTER — Emergency Department (HOSPITAL_BASED_OUTPATIENT_CLINIC_OR_DEPARTMENT_OTHER)
Admission: EM | Admit: 2015-10-27 | Discharge: 2015-10-27 | Disposition: A | Discharge status: Home / Self Care | Payer: Medicare Other | Attending: Emergency Medicine | Admitting: Emergency Medicine

## 2015-10-27 DIAGNOSIS — L03116 Cellulitis of left lower limb: Secondary | ICD-10-CM

## 2015-10-27 DIAGNOSIS — Z872 Personal history of diseases of the skin and subcutaneous tissue: Secondary | ICD-10-CM | POA: Diagnosis not present

## 2015-10-27 DIAGNOSIS — N186 End stage renal disease: Secondary | ICD-10-CM | POA: Diagnosis not present

## 2015-10-27 DIAGNOSIS — F329 Major depressive disorder, single episode, unspecified: Secondary | ICD-10-CM | POA: Insufficient documentation

## 2015-10-27 DIAGNOSIS — J45909 Unspecified asthma, uncomplicated: Secondary | ICD-10-CM | POA: Insufficient documentation

## 2015-10-27 DIAGNOSIS — R2242 Localized swelling, mass and lump, left lower limb: Secondary | ICD-10-CM | POA: Diagnosis present

## 2015-10-27 DIAGNOSIS — M79605 Pain in left leg: Secondary | ICD-10-CM

## 2015-10-27 DIAGNOSIS — Z79899 Other long term (current) drug therapy: Secondary | ICD-10-CM | POA: Diagnosis not present

## 2015-10-27 DIAGNOSIS — E039 Hypothyroidism, unspecified: Secondary | ICD-10-CM | POA: Insufficient documentation

## 2015-10-27 DIAGNOSIS — Z862 Personal history of diseases of the blood and blood-forming organs and certain disorders involving the immune mechanism: Secondary | ICD-10-CM | POA: Diagnosis not present

## 2015-10-27 DIAGNOSIS — M7989 Other specified soft tissue disorders: Secondary | ICD-10-CM

## 2015-10-27 DIAGNOSIS — Z8739 Personal history of other diseases of the musculoskeletal system and connective tissue: Secondary | ICD-10-CM | POA: Diagnosis not present

## 2015-10-27 LAB — BASIC METABOLIC PANEL
Anion gap: 9 (ref 5–15)
BUN: 58 mg/dL — AB (ref 6–20)
CHLORIDE: 102 mmol/L (ref 101–111)
CO2: 30 mmol/L (ref 22–32)
CREATININE: 7.88 mg/dL — AB (ref 0.44–1.00)
Calcium: 9 mg/dL (ref 8.9–10.3)
GFR calc Af Amer: 6 mL/min — ABNORMAL LOW (ref >60)
GFR calc non Af Amer: 5 mL/min — ABNORMAL LOW (ref >60)
GLUCOSE: 103 mg/dL — AB (ref 65–99)
Potassium: 3.9 mmol/L (ref 3.5–5.1)
Sodium: 141 mmol/L (ref 135–145)

## 2015-10-27 LAB — CBC WITH DIFFERENTIAL/PLATELET
Basophils Absolute: 0 10*3/uL (ref 0.0–0.1)
Basophils Relative: 1 %
Eosinophils Absolute: 0.2 10*3/uL (ref 0.0–0.7)
Eosinophils Relative: 4 %
HCT: 32.9 % — ABNORMAL LOW (ref 36.0–46.0)
HEMOGLOBIN: 11 g/dL — AB (ref 12.0–15.0)
LYMPHS ABS: 1.4 10*3/uL (ref 0.7–4.0)
Lymphocytes Relative: 32 %
MCH: 33.8 pg (ref 26.0–34.0)
MCHC: 33.4 g/dL (ref 30.0–36.0)
MCV: 101.2 fL — ABNORMAL HIGH (ref 78.0–100.0)
MONOS PCT: 13 %
Monocytes Absolute: 0.5 10*3/uL (ref 0.1–1.0)
NEUTROS PCT: 50 %
Neutro Abs: 2.1 10*3/uL (ref 1.7–7.7)
Platelets: 172 10*3/uL (ref 150–400)
RBC: 3.25 MIL/uL — ABNORMAL LOW (ref 3.87–5.11)
RDW: 12.7 % (ref 11.5–15.5)
WBC: 4.3 10*3/uL (ref 4.0–10.5)

## 2015-10-27 MED ORDER — CLINDAMYCIN HCL 150 MG PO CAPS
300.0000 mg | ORAL_CAPSULE | Freq: Once | ORAL | Status: AC
  Administered 2015-10-27: 300 mg via ORAL
  Filled 2015-10-27: qty 2

## 2015-10-27 MED ORDER — CLINDAMYCIN HCL 300 MG PO CAPS: 300.0000 mg | ORAL_CAPSULE | Freq: Four times a day (QID) | ORAL | Status: DC

## 2015-10-27 NOTE — ED Provider Notes (Signed)
CSN: [id number]     Arrival date & time 10/27/15  1530 History  By signing my name below, I, Doreatha Martin, attest that this documentation has been prepared under the direction and in the presence of Arby Barrette, MD. Electronically Signed: Doreatha Martin, ED Scribe. 10/27/2015. 6:51 PM.    Chief Complaint  Patient presents with  . Leg Swelling   The history is provided by the patient. No language interpreter was used.    HPI Comments: [name] is a 64 y.o. female with h/o asthma, morbid obesity, psoriasis, CKD, hypothyroidism, cellulitis who presents to the Emergency Department complaining of moderate left leg swelling and redness onset 3 days ago. She states associated fever (tmax 101.6), chills, left leg pain, left groin pain. Pt was seen by [facility] for her symptoms this morning and was advised to come to the ED to r/o DVT. Pt reports that her current symptoms are similar to prior episodes of cellulitis. She notes that she is normally treated by her PCP with antibiotics for her cellulitis in the past with resolution of her symptoms. No prior hospital admissions for cellulitis. No h/o DVT/PE. She denies abdominal pain, nausea, emesis, cough, CP, SOB.   Past Medical History  Diagnosis Date  . Asthma   . Morbid obesity (HCC)   . Psoriasis   . Depression   . Fibromyalgia   . Osteoarthritis   . Hypothyroidism   . Shortness of breath dyspnea   . Chronic kidney disease   . Anemia    Past Surgical History  Procedure Laterality Date  . Total knee arthroplasty Left 2012    partial knee replacement  . Total hip arthroplasty Right     right  . Rotator cuff repair Right   . Colonoscopy w/ polypectomy    . Av fistula placement Left 12/05/2014    Procedure: INSERTION OF Left Arm  Arteriovenous Fistula ;  Surgeon: Larina Earthly, MD;  Location: [facility] OR;  Service: Vascular;  Laterality: Left;  . Insertion of dialysis catheter N/A 12/05/2014    Procedure: INSERTION OF DIALYSIS CATHETER;  Surgeon:  Larina Earthly, MD;  Location[facility] OR;  Service: Vascular;  Laterality: N/A;  . Fistula superficialization Left 01/26/2015    Procedure: FISTULA SUPERFICIALIZATION- LEFT UPPER ARM;  Surgeon: Larina Earthly, MD;  Location: [facility] OR;  Service: Vascular;  Laterality: Left;   Family History  Problem Relation Age of Onset  . COPD Mother   . Diabetes Mother   . COPD Other   . Diabetes Other    Social History  Substance Use Topics  . Smoking status: Never Smoker   . Smokeless tobacco: Never Used  . Alcohol Use: No   OB History    No data available     Review of Systems 10 Systems reviewed and are negative for acute change except as noted in the HPI.   Allergies  Ivp dye; Rocephin; and Tape  Home Medications   Prior to Admission medications   Medication Sig Start Date End Date Taking? Authorizing Provider  albuterol (PROVENTIL) (2.5 MG/3ML) 0.083% nebulizer solution Take 3 mLs (2.5 mg total) by nebulization every 6 (six) hours as needed for wheezing. 09/13/11 01/22/15  April Palumbo, MD  atorvastatin (LIPITOR) 40 MG tablet Take 40 mg by mouth daily.    Historical Provider, MD  buPROPion (WELLBUTRIN SR) 150 MG 12 hr tablet Take 150 mg by mouth daily.     Historical Provider, MD  calcium acetate (PHOSLO) 667 MG capsule  Take 1,334 mg by mouth 3 (three) times daily with meals.    Historical Provider, MD  clindamycin (CLEOCIN) 300 MG capsule Take 1 capsule (300 mg total) by mouth 4 (four) times daily. X 7 days 10/27/15   Arby BarretteMarcy Taneisha Fuson, MD  gemfibrozil (LOPID) 600 MG tablet Take 600 mg by mouth 2 (two) times daily before a meal.    Historical Provider, MD  levothyroxine (SYNTHROID, LEVOTHROID) 112 MCG tablet Take 112 mcg by mouth daily before breakfast.    Historical Provider, MD  methocarbamol (ROBAXIN) 500 MG tablet Take 500 mg by mouth 4 (four) times daily as needed. For muscle spasm    Historical Provider, MD  traMADol (ULTRAM) 50 MG tablet Take 1 tablet (50 mg total) by mouth every 6 (six) hours  as needed for moderate pain. 01/26/15   Samantha J Rhyne, PA-C   BP 117/52 mmHg  Pulse 83  Temp(Src) 98.3 F (36.8 C)  Resp 17  Ht 5\' 3"  (1.6 m)  Wt 230 lb (104.327 kg)  BMI 40.75 kg/m2  SpO2 100% Physical Exam  Constitutional: She is oriented to person, place, and time.  Patient is alert and nontoxic. No respiratory distress.  HENT:  Head: Normocephalic and atraumatic.  Eyes: Conjunctivae and EOM are normal. No scleral icterus.  Neck: Normal range of motion. Neck supple.  Cardiovascular: Normal rate, regular rhythm, normal heart sounds and intact distal pulses.   Pulmonary/Chest: Effort normal and breath sounds normal. No respiratory distress. She has no wheezes. She has no rales.  Abdominal: Soft. She exhibits no distension. There is no tenderness.  Musculoskeletal: Normal range of motion. She exhibits edema and tenderness.  Patient has AV fistula in the left upper extremity normal appearance. Left lower extremity has diffuse erythema from below the knee to just above the ankle. This is blanching. No areas of ulcerations or wounds. No tenderness in the popliteal fossa.  Neurological: She is alert and oriented to person, place, and time. Coordination normal.  Skin: Skin is warm and dry.  Psychiatric: She has a normal mood and affect. Her behavior is normal.  Nursing note and vitals reviewed.   ED Course  Procedures (including critical care time) DIAGNOSTIC STUDIES: Oxygen Saturation is 98% on RA, normal by my interpretation.    COORDINATION OF CARE: 6:49 PM Discussed treatment plan with pt at bedside which includes US and pt agreed to plan.  8:27 PM Pt reevaluated and informed of normal US results.    Labs Review Labs Reviewed  CBC WITH DIFFERENTIAL/PLATELET - Abnormal; Notable for the following:    RBC 3.25 (*)    Hemoglobin 11.0 (*)    HCT 32.9 (*)    MCV 101.2 (*)    All other components within normal limits  BASIC METABOLIC PANEL - Abnormal; Notable for the  following:    Glucose, Bld 103 (*)    BUN 58 (*)    Creatinine, Ser 7.88 (*)    GFR calc non Af Amer 5 (*)    GFR calc Af Amer 6 (*)    All other components within normal limits    Imaging Review [place] Venous Img Lower Unilateral Left  10/27/2015  CLINICAL DATA:  64 year old female with lower extremity swelling and redness and pain. EXAM: Left LOWER EXTREMITY VENOUS DOPPLER ULTRASOUND TECHNIQUE: Gray-scale sonography with graded compression, as well as color Doppler and duplex ultrasound were performed to evaluate the lower extremity deep venous systems from the level of the common femoral vein and including the common  femoral, femoral, profunda femoral, popliteal and calf veins including the posterior tibial, peroneal and gastrocnemius veins when visible. The superficial great saphenous vein was also interrogated. Spectral Doppler was utilized to evaluate flow at rest and with distal augmentation maneuvers in the common femoral, femoral and popliteal veins. COMPARISON:  None. FINDINGS: Contralateral Common Femoral Vein: Respiratory phasicity is normal and symmetric with the symptomatic side. No evidence of thrombus. Normal compressibility. Common Femoral Vein: No evidence of thrombus. Normal compressibility, respiratory phasicity and response to augmentation. Saphenofemoral Junction: No evidence of thrombus. Normal compressibility and flow on color Doppler imaging. Profunda Femoral Vein: No evidence of thrombus. Normal compressibility and flow on color Doppler imaging. Femoral Vein: No evidence of thrombus. Normal compressibility, respiratory phasicity and response to augmentation. Popliteal Vein: No evidence of thrombus. Normal compressibility, respiratory phasicity and response to augmentation. Calf Veins: No evidence of thrombus. Normal compressibility and flow on color Doppler imaging. Superficial Great Saphenous Vein: No evidence of thrombus. Normal compressibility and flow on color Doppler imaging.  There is diffuse soft tissue edema in the left lower extremity. IMPRESSION: No evidence of deep venous thrombosis. Electronically Signed   By: Elgie Collard M.D.   On: 10/27/2015 20:14   I have personally reviewed and evaluated these images and lab results as part of my medical decision-making.   MDM   Final diagnoses:  Cellulitis of left lower extremity   Patient reports that she gets similar symptoms with cellulitis. She reports she did get seen at the urgent care and they were concern for blood clot referring her to the emergency department for an ultrasound. She denies a prior history of DVT. Patient has not had fever, chest pain or shortness of breath. Patient reports in the past she treats a similar cellulitis at home on antibiotics with elevation and rest. She reports that her family physician will follow her progress closely and at this time she wishes to start with outpatient therapy. The patient is nontoxic and alert. She has comorbid illness of chronic kidney failure but otherwise is in stable condition. She is reliable for return to the Hospital if worsening or changing condition. Patient has reportedly experienced anaphylactic type reaction to Rocephin, at this time she will be placed on clindamycin for cellulitis with history of similar cellulitis and comorbid illness.  Arby Barrette, MD 10/27/15 2046

## 2015-10-27 NOTE — Discharge Instructions (Signed)

## 2015-10-27 NOTE — ED Notes (Signed)
Pt c/o left leg swelling and redness HX cellulitis

## 2016-03-30 ENCOUNTER — Ambulatory Visit
Admission: RE | Admit: 2016-03-30 | Discharge: 2016-03-30 | Disposition: A | Discharge status: Home / Self Care | Payer: Medicare Other | Source: Ambulatory Visit | Attending: Nephrology | Admitting: Nephrology

## 2016-03-30 ENCOUNTER — Other Ambulatory Visit: Payer: Self-pay | Admitting: Nephrology

## 2016-03-30 DIAGNOSIS — R7611 Nonspecific reaction to tuberculin skin test without active tuberculosis: Secondary | ICD-10-CM

## 2016-04-07 NOTE — H&P (Signed)
TOTAL HIP ADMISSION H&P  Patient is admitted for left total hip arthroplasty, anterior approach.  Subjective:  Chief Complaint:    Left hip primary OA / pain  HPI: [name], 65 y.o. female, has a history of pain and functional disability in the left hip(s) due to arthritis and patient has failed non-surgical conservative treatments for greater than 12 weeks to include NSAID's and/or analgesics, use of assistive devices and activity modification.  Onset of symptoms was gradual starting  years ago with gradually worsening course since that time.The patient noted prior procedures of the hip to include arthroplasty on the right hip per Dr. Charlann Boxerlin in 2006.  Patient currently rates pain in the left hip at 9 out of 10 with activity. Patient has night pain, worsening of pain with activity and weight bearing, trendelenberg gait, pain that interfers with activities of daily living and pain with passive range of motion. Patient has evidence of periarticular osteophytes and joint space narrowing by imaging studies. This condition presents safety issues increasing the risk of falls.  There is no current active infection.   Risks, benefits and expectations were discussed with the patient.  Risks including but not limited to the risk of anesthesia, blood clots, nerve damage, blood vessel damage, failure of the prosthesis, infection and up to and including death.  Patient understand the risks, benefits and expectations and wishes to proceed with surgery.   PCP: Michiel SitesKOHUT,WALTER DENNIS, MD  D/C Plans:      Home with HHPT  Post-op Meds:       No Rx given  Tranexamic Acid:      To be given - IV   Decadron:      Is to be given  FYI:     ? Lovenox  (ESRD - dialysis - MWF)  Oxycodone     Past Medical History  Diagnosis Date  . Asthma   . Morbid obesity (HCC)   . Psoriasis   . Depression   . Fibromyalgia   . Osteoarthritis   . Hypothyroidism   . Shortness of breath dyspnea   . Chronic kidney disease   .  Anemia     Past Surgical History  Procedure Laterality Date  . Total knee arthroplasty Left 2012    partial knee replacement  . Total hip arthroplasty Right     right  . Rotator cuff repair Right   . Colonoscopy w/ polypectomy    . Av fistula placement Left 12/05/2014    Procedure: INSERTION OF Left Arm  Arteriovenous Fistula ;  Surgeon: Larina Earthlyodd F Early, MD;  [facility];  Service: Vascular;  Laterality: Left;  . Insertion of dialysis catheter N/A 12/05/2014    Procedure: INSERTION OF DIALYSIS CATHETER;  Surgeon: Larina Earthlyodd F Early, MD;  [facility];  Service: Vascular;  Laterality: N/A;  . Fistula superficialization Left 01/26/2015    Procedure: FISTULA SUPERFICIALIZATION- LEFT UPPER ARM;  Surgeon: Larina Earthlyodd F Early, MD;  [facility];  Service: Vascular;  Laterality: Left;    No prescriptions prior to admission   Allergies  Allergen Reactions  . Ivp Dye [Iodinated Diagnostic Agents] Anaphylaxis  . Rocephin [Ceftriaxone Sodium In Dextrose] Anaphylaxis  . Tape Other (See Comments)    Paper     Social History  Substance Use Topics  . Smoking status: Never Smoker   . Smokeless tobacco: Never Used  . Alcohol Use: No    Family History  Problem Relation Age of Onset  . COPD Mother   .  Diabetes Mother   . COPD Other   . Diabetes Other      Review of Systems  Constitutional: Negative.   HENT: Negative.   Eyes: Negative.   Respiratory: Positive for shortness of breath (with exertion).   Cardiovascular: Negative.   Gastrointestinal: Negative.   Genitourinary: Negative.   Musculoskeletal: Positive for joint pain.  Skin: Negative.   Neurological: Negative.   Endo/Heme/Allergies: Negative.   Psychiatric/Behavioral: Positive for depression.    Objective:  Physical Exam  Constitutional: She is oriented to person, place, and time. She appears well-developed.  HENT:  Head: Normocephalic.  Eyes: Pupils are equal, round, and reactive to light.  Neck: Neck supple. No JVD present.  No tracheal deviation present. No thyromegaly present.  Cardiovascular: Normal rate, regular rhythm, normal heart sounds and intact distal pulses.   Respiratory: Effort normal and breath sounds normal. No stridor. No respiratory distress. She has no wheezes.  GI: Soft. There is no tenderness. There is no guarding.  Musculoskeletal:       Left hip: She exhibits decreased range of motion, decreased strength, tenderness and bony tenderness. She exhibits no swelling, no deformity and no laceration.  Lymphadenopathy:    She has no cervical adenopathy.  Neurological: She is alert and oriented to person, place, and time.  Skin: Skin is warm and dry.  Psychiatric: She has a normal mood and affect.     Labs:  Estimated body mass index is 40.75 kg/(m^2) as calculated from the following:   Height as of 10/27/15:  (1.6 m).   Weight as of 10/27/15: 104.327 kg (230 lb).   Imaging Review Plain radiographs demonstrate severe degenerative joint disease of the left hip(s). The bone quality appears to be good for age and reported activity level.  Assessment/Plan:  End stage arthritis, left hip(s)  The patient history, physical examination, clinical judgement of the provider and imaging studies are consistent with end stage degenerative joint disease of the left hip(s) and total hip arthroplasty is deemed medically necessary. The treatment options including medical management, injection therapy, arthroscopy and arthroplasty were discussed at length. The risks and benefits of total hip arthroplasty were presented and reviewed. The risks due to aseptic loosening, infection, stiffness, dislocation/subluxation,  thromboembolic complications and other imponderables were discussed.  The patient acknowledged the explanation, agreed to proceed with the plan and consent was signed. Patient is being admitted for inpatient treatment for surgery, pain control, PT, OT, prophylactic antibiotics, VTE prophylaxis,  progressive ambulation and ADL's and discharge planning.The patient is planning to be discharged home with home health services.        Anastasio Auerbach Trequan Marsolek   PA-C  04/07/2016, 9:38 AM

## 2016-04-18 NOTE — Patient Instructions (Signed)
[name]  04/18/2016   Your procedure is scheduled on: 04/26/2016    Report to [facility]  Entrance take [place]  elevators to 3rd floor to  Short Stay Center at   0830 AM.  Call this number if you have problems the morning of surgery [phone]   Remember: ONLY 1 PERSON MAY GO WITH YOU TO SHORT STAY TO GET  READY MORNING OF YOUR SURGERY.  Do not eat food or drink liquids :After Midnight.     Take these medicines the morning of surgery with A SIP OF WATER: Albuterol Inhaler if needed and bring, Wellbutrin, Synthroid                                 You may not have any metal on your body including hair pins and              piercings  Do not wear jewelry, make-up, lotions, powders or perfumes, deodorant             Do not wear nail polish.  Do not shave  48 hours prior to surgery.     Do not bring valuables to the hospital. [facility] IS NOT             RESPONSIBLE   FOR VALUABLES.  Contacts, dentures or bridgework may not be worn into surgery.  Leave suitcase in the car. After surgery it may be brought to your room.         Special Instructions: coughing and deep breathing exercises, leg exercises               Please read over the following fact sheets you were given: _____________________________________________________________________             [facility] - Preparing for [facility] Before surgery, you can play an important role.  Because skin is not sterile, your skin needs to be as free of germs as possible.  You can reduce the number of germs on your skin by washing with CHG (chlorahexidine gluconate) soap before surgery.  CHG is an antiseptic cleaner which kills germs and bonds with the skin to continue killing germs even after washing. Please DO NOT use if you have an allergy to CHG or antibacterial soaps.  If your skin becomes reddened/irritated stop using the CHG and inform your nurse when you arrive at Short Stay. Do not shave  (including legs and underarms) for at least 48 hours prior to the first CHG shower.  You may shave your face/neck. Please follow these instructions carefully:  1.  Shower with CHG Soap the night before surgery and the  morning of Surgery.  2.  If you choose to wash your hair, wash your hair first as usual with your  normal  shampoo.  3.  After you shampoo, rinse your hair and body thoroughly to remove the  shampoo.                           4.  Use CHG as you would any other liquid soap.  You can apply chg directly  to the skin and wash                       Gently with a scrungie or  clean washcloth.  5.  Apply the CHG Soap to your body ONLY FROM THE NECK DOWN.   Do not use on face/ open                           Wound or open sores. Avoid contact with eyes, ears mouth and genitals (private parts).                       Wash face,  Genitals (private parts) with your normal soap.             6.  Wash thoroughly, paying special attention to the area where your surgery  will be performed.  7.  Thoroughly rinse your body with warm water from the neck down.  8.  DO NOT shower/wash with your normal soap after using and rinsing off  the CHG Soap.                9.  Pat yourself dry with a clean towel.            10.  Wear clean pajamas.            11.  Place clean sheets on your bed the night of your first shower and do not  sleep with pets. Day of Surgery : Do not apply any lotions/deodorants the morning of surgery.  Please wear clean clothes to the hospital/surgery center.  FAILURE TO FOLLOW THESE INSTRUCTIONS MAY RESULT IN THE CANCELLATION OF YOUR SURGERY PATIENT SIGNATURE_________________________________  NURSE SIGNATURE__________________________________  ________________________________________________________________________  WHAT IS A BLOOD TRANSFUSION? Blood Transfusion Information  A transfusion is the replacement of blood or some of its parts. Blood is made up of multiple cells which  provide different functions.  Red blood cells carry oxygen and are used for blood loss replacement.  White blood cells fight against infection.  Platelets control bleeding.  Plasma helps clot blood.  Other blood products are available for specialized needs, such as hemophilia or other clotting disorders. BEFORE THE TRANSFUSION  Who gives blood for transfusions?   Healthy volunteers who are fully evaluated to make sure their blood is safe. This is blood bank blood. Transfusion therapy is the safest it has ever been in the practice of medicine. Before blood is taken from a donor, a complete history is taken to make sure that person has no history of diseases nor engages in risky social behavior (examples are intravenous drug use or sexual activity with multiple partners). The donor's travel history is screened to minimize risk of transmitting infections, such as malaria. The donated blood is tested for signs of infectious diseases, such as HIV and hepatitis. The blood is then tested to be sure it is compatible with you in order to minimize the chance of a transfusion reaction. If you or a relative donates blood, this is often done in anticipation of surgery and is not appropriate for emergency situations. It takes many days to process the donated blood. RISKS AND COMPLICATIONS Although transfusion therapy is very safe and saves many lives, the main dangers of transfusion include:  1. Getting an infectious disease. 2. Developing a transfusion reaction. This is an allergic reaction to something in the blood you were given. Every precaution is taken to prevent this. The decision to have a blood transfusion has been considered carefully by your caregiver before blood is given. Blood is not given unless the benefits outweigh the risks. AFTER THE TRANSFUSION  Right after receiving a blood transfusion, you will usually feel much better and more energetic. This is especially true if your red blood cells  have gotten low (anemic). The transfusion raises the level of the red blood cells which carry oxygen, and this usually causes an energy increase.  The nurse administering the transfusion will monitor you carefully for complications. HOME CARE INSTRUCTIONS  No special instructions are needed after a transfusion. You may find your energy is better. Speak with your caregiver about any limitations on activity for underlying diseases you may have. SEEK MEDICAL CARE IF:   Your condition is not improving after your transfusion.  You develop redness or irritation at the intravenous (IV) site. SEEK IMMEDIATE MEDICAL CARE IF:  Any of the following symptoms occur over the next 12 hours:  Shaking chills.  You have a temperature by mouth above 102 F (38.9 C), not controlled by medicine.  Chest, back, or muscle pain.  People around you feel you are not acting correctly or are confused.  Shortness of breath or difficulty breathing.  Dizziness and fainting.  You get a rash or develop hives.  You have a decrease in urine output.  Your urine turns a dark color or changes to pink, red, or brown. Any of the following symptoms occur over the next 10 days:  You have a temperature by mouth above 102 F (38.9 C), not controlled by medicine.  Shortness of breath.  Weakness after normal activity.  The white part of the eye turns yellow (jaundice).  You have a decrease in the amount of urine or are urinating less often.  Your urine turns a dark color or changes to pink, red, or brown. Document Released: 10/14/2000 Document Revised: 01/09/2012 Document Reviewed: 06/02/2008 ExitCare Patient Information 2014 [place].  _______________________________________________________________________  Incentive Spirometer  An incentive spirometer is a tool that can help keep your lungs clear and active. This tool measures how well you are filling your lungs with each breath. Taking long deep  breaths may help reverse or decrease the chance of developing breathing (pulmonary) problems (especially infection) following:  A long period of time when you are unable to move or be active. BEFORE THE PROCEDURE   If the spirometer includes an indicator to show your best effort, your nurse or respiratory therapist will set it to a desired goal.  If possible, sit up straight or lean slightly forward. Try not to slouch.  Hold the incentive spirometer in an upright position. INSTRUCTIONS FOR USE  3. Sit on the edge of your bed if possible, or sit up as far as you can in bed or on a chair. 4. Hold the incentive spirometer in an upright position. 5. Breathe out normally. 6. Place the mouthpiece in your mouth and seal your lips tightly around it. 7. Breathe in slowly and as deeply as possible, raising the piston or the ball toward the top of the column. 8. Hold your breath for 3-5 seconds or for as long as possible. Allow the piston or ball to fall to the bottom of the column. 9. Remove the mouthpiece from your mouth and breathe out normally. 10. Rest for a few seconds and repeat Steps 1 through 7 at least 10 times every 1-2 hours when you are awake. Take your time and take a few normal breaths between deep breaths. 11. The spirometer may include an indicator to show your best effort. Use the indicator as a goal to work toward during each repetition. 12. After  each set of 10 deep breaths, practice coughing to be sure your lungs are clear. If you have an incision (the cut made at the time of surgery), support your incision when coughing by placing a pillow or rolled up towels firmly against it. Once you are able to get out of bed, walk around indoors and cough well. You may stop using the incentive spirometer when instructed by your caregiver.  RISKS AND COMPLICATIONS  Take your time so you do not get dizzy or light-headed.  If you are in pain, you may need to take or ask for pain medication  before doing incentive spirometry. It is harder to take a deep breath if you are having pain. AFTER USE  Rest and breathe slowly and easily.  It can be helpful to keep track of a log of your progress. Your caregiver can provide you with a simple table to help with this. If you are using the spirometer at home, follow these instructions: [place] IF:   You are having difficultly using the spirometer.  You have trouble using the spirometer as often as instructed.  Your pain medication is not giving enough relief while using the spirometer.  You develop fever of 100.5 F (38.1 C) or higher. SEEK IMMEDIATE MEDICAL CARE IF:   You cough up bloody sputum that had not been present before.  You develop fever of 102 F (38.9 C) or greater.  You develop worsening pain at or near the incision site. MAKE SURE YOU:   Understand these instructions.  Will watch your condition.  Will get help right away if you are not doing well or get worse. Document Released: 02/27/2007 Document Revised: 01/09/2012 Document Reviewed: 04/30/2007 [facility] Patient Information 2014 [place], [place].   ________________________________________________________________________

## 2016-04-19 ENCOUNTER — Encounter (HOSPITAL_COMMUNITY): Payer: Self-pay

## 2016-04-19 ENCOUNTER — Encounter (HOSPITAL_COMMUNITY)
Admission: RE | Admit: 2016-04-19 | Discharge: 2016-04-19 | Disposition: A | Discharge status: Home / Self Care | Payer: Medicare Other | Source: Ambulatory Visit | Attending: Orthopedic Surgery | Admitting: Orthopedic Surgery

## 2016-04-19 DIAGNOSIS — Z01812 Encounter for preprocedural laboratory examination: Secondary | ICD-10-CM | POA: Insufficient documentation

## 2016-04-19 DIAGNOSIS — Z01818 Encounter for other preprocedural examination: Secondary | ICD-10-CM | POA: Diagnosis present

## 2016-04-19 DIAGNOSIS — M1612 Unilateral primary osteoarthritis, left hip: Secondary | ICD-10-CM | POA: Insufficient documentation

## 2016-04-19 HISTORY — DX: Laceration without foreign body of other part of head, initial encounter: S01.81XA

## 2016-04-19 HISTORY — DX: Unspecified hearing loss, unspecified ear: H91.90

## 2016-04-19 HISTORY — DX: Unspecified cataract: H26.9

## 2016-04-19 LAB — SURGICAL PCR SCREEN
MRSA, PCR: NEGATIVE
Staphylococcus aureus: POSITIVE — AB

## 2016-04-20 NOTE — Progress Notes (Signed)
LOV DR DETERDING RENAL 04-18-16 ON CHART

## 2016-04-25 MED ORDER — VANCOMYCIN HCL 10 G IV SOLR
1500.0000 mg | INTRAVENOUS | Status: AC
  Administered 2016-04-26: 1500 mg via INTRAVENOUS
  Filled 2016-04-25: qty 1500

## 2016-04-26 ENCOUNTER — Inpatient Hospital Stay (HOSPITAL_COMMUNITY)
Admission: RE | Admit: 2016-04-26 | Discharge: 2016-04-28 | DRG: 469 | Disposition: A | Discharge status: Home Health | Payer: Medicare Other | Source: Ambulatory Visit | Attending: Orthopedic Surgery | Admitting: Orthopedic Surgery

## 2016-04-26 ENCOUNTER — Encounter (HOSPITAL_COMMUNITY): Payer: Self-pay | Admitting: *Deleted

## 2016-04-26 ENCOUNTER — Encounter (HOSPITAL_COMMUNITY)
Admission: RE | Disposition: A | Discharge status: Home Health | Payer: Self-pay | Source: Ambulatory Visit | Attending: Orthopedic Surgery

## 2016-04-26 ENCOUNTER — Inpatient Hospital Stay (HOSPITAL_COMMUNITY): Payer: Medicare Other

## 2016-04-26 ENCOUNTER — Inpatient Hospital Stay (HOSPITAL_COMMUNITY): Payer: Medicare Other | Admitting: Anesthesiology

## 2016-04-26 DIAGNOSIS — Z96652 Presence of left artificial knee joint: Secondary | ICD-10-CM | POA: Diagnosis present

## 2016-04-26 DIAGNOSIS — Z6841 Body Mass Index (BMI) 40.0 and over, adult: Secondary | ICD-10-CM

## 2016-04-26 DIAGNOSIS — J45909 Unspecified asthma, uncomplicated: Secondary | ICD-10-CM | POA: Diagnosis present

## 2016-04-26 DIAGNOSIS — M797 Fibromyalgia: Secondary | ICD-10-CM | POA: Diagnosis present

## 2016-04-26 DIAGNOSIS — E039 Hypothyroidism, unspecified: Secondary | ICD-10-CM | POA: Diagnosis present

## 2016-04-26 DIAGNOSIS — N186 End stage renal disease: Secondary | ICD-10-CM | POA: Diagnosis present

## 2016-04-26 DIAGNOSIS — F329 Major depressive disorder, single episode, unspecified: Secondary | ICD-10-CM | POA: Diagnosis present

## 2016-04-26 DIAGNOSIS — Z992 Dependence on renal dialysis: Secondary | ICD-10-CM | POA: Diagnosis not present

## 2016-04-26 DIAGNOSIS — Z96641 Presence of right artificial hip joint: Secondary | ICD-10-CM | POA: Diagnosis present

## 2016-04-26 DIAGNOSIS — M25552 Pain in left hip: Secondary | ICD-10-CM | POA: Diagnosis present

## 2016-04-26 DIAGNOSIS — M1630 Unilateral osteoarthritis resulting from hip dysplasia, unspecified hip: Secondary | ICD-10-CM | POA: Diagnosis present

## 2016-04-26 DIAGNOSIS — Z96649 Presence of unspecified artificial hip joint: Secondary | ICD-10-CM

## 2016-04-26 HISTORY — PX: TOTAL HIP ARTHROPLASTY: SHX124

## 2016-04-26 LAB — BASIC METABOLIC PANEL
ANION GAP: 10 (ref 5–15)
BUN: 42 mg/dL — ABNORMAL HIGH (ref 6–20)
CHLORIDE: 99 mmol/L — AB (ref 101–111)
CO2: 28 mmol/L (ref 22–32)
Calcium: 9.1 mg/dL (ref 8.9–10.3)
Creatinine, Ser: 6.67 mg/dL — ABNORMAL HIGH (ref 0.44–1.00)
GFR calc Af Amer: 7 mL/min — ABNORMAL LOW (ref >60)
GFR, EST NON AFRICAN AMERICAN: 6 mL/min — AB (ref >60)
GLUCOSE: 94 mg/dL (ref 65–99)
POTASSIUM: 4.4 mmol/L (ref 3.5–5.1)
Sodium: 137 mmol/L (ref 135–145)

## 2016-04-26 LAB — TYPE AND SCREEN
ABO/RH(D): AB POS
Antibody Screen: NEGATIVE

## 2016-04-26 LAB — CBC
HEMATOCRIT: 29.6 % — AB (ref 36.0–46.0)
HEMOGLOBIN: 10.1 g/dL — AB (ref 12.0–15.0)
MCH: 32.5 pg (ref 26.0–34.0)
MCHC: 34.1 g/dL (ref 30.0–36.0)
MCV: 95.2 fL (ref 78.0–100.0)
Platelets: 164 10*3/uL (ref 150–400)
RBC: 3.11 MIL/uL — ABNORMAL LOW (ref 3.87–5.11)
RDW: 13 % (ref 11.5–15.5)
WBC: 5.9 10*3/uL (ref 4.0–10.5)

## 2016-04-26 SURGERY — ARTHROPLASTY, HIP, TOTAL, ANTERIOR APPROACH
Anesthesia: General | Site: Hip | Laterality: Left

## 2016-04-26 MED ORDER — PHENYLEPHRINE HCL 10 MG/ML IJ SOLN
INTRAMUSCULAR | Status: DC | PRN
  Administered 2016-04-26: 80 ug via INTRAVENOUS

## 2016-04-26 MED ORDER — ACETAMINOPHEN 325 MG PO TABS: 650.0000 mg | ORAL_TABLET | Freq: Four times a day (QID) | ORAL | Status: DC | PRN

## 2016-04-26 MED ORDER — ATORVASTATIN CALCIUM 20 MG PO TABS
40.0000 mg | ORAL_TABLET | Freq: Every evening | ORAL | Status: DC
  Administered 2016-04-26 – 2016-04-27 (×2): 40 mg via ORAL
  Filled 2016-04-26 (×2): qty 2

## 2016-04-26 MED ORDER — VANCOMYCIN HCL 10 G IV SOLR
1500.0000 mg | Freq: Two times a day (BID) | INTRAVENOUS | Status: AC
  Administered 2016-04-26: 1500 mg via INTRAVENOUS
  Filled 2016-04-26: qty 1500

## 2016-04-26 MED ORDER — HYDROMORPHONE HCL 1 MG/ML IJ SOLN: 0.5000 mg | INTRAMUSCULAR | Status: DC | PRN

## 2016-04-26 MED ORDER — SODIUM CHLORIDE 0.9 % IV SOLN
INTRAVENOUS | Status: DC
  Administered 2016-04-26: 09:00:00 via INTRAVENOUS

## 2016-04-26 MED ORDER — OXYCODONE HCL 5 MG PO TABS
5.0000 mg | ORAL_TABLET | ORAL | Status: DC
  Administered 2016-04-26: 5 mg via ORAL
  Administered 2016-04-27 (×3): 10 mg via ORAL
  Administered 2016-04-27: 5 mg via ORAL
  Administered 2016-04-27: 10 mg via ORAL
  Administered 2016-04-27: 5 mg via ORAL
  Administered 2016-04-27 – 2016-04-28 (×5): 10 mg via ORAL
  Filled 2016-04-26: qty 1
  Filled 2016-04-26 (×3): qty 2
  Filled 2016-04-26: qty 1
  Filled 2016-04-26 (×4): qty 2
  Filled 2016-04-26: qty 1
  Filled 2016-04-26 (×3): qty 2

## 2016-04-26 MED ORDER — FENTANYL CITRATE (PF) 100 MCG/2ML IJ SOLN
INTRAMUSCULAR | Status: DC | PRN
  Administered 2016-04-26: 50 ug via INTRAVENOUS
  Administered 2016-04-26 (×2): 100 ug via INTRAVENOUS

## 2016-04-26 MED ORDER — METOCLOPRAMIDE HCL 5 MG PO TABS: 5.0000 mg | ORAL_TABLET | Freq: Three times a day (TID) | ORAL | Status: DC | PRN

## 2016-04-26 MED ORDER — DEXAMETHASONE SODIUM PHOSPHATE 10 MG/ML IJ SOLN: 10.0000 mg | Freq: Once | INTRAMUSCULAR | Status: DC

## 2016-04-26 MED ORDER — GLYCOPYRROLATE 0.2 MG/ML IJ SOLN
INTRAMUSCULAR | Status: DC | PRN
  Administered 2016-04-26: .5 mg via INTRAVENOUS

## 2016-04-26 MED ORDER — TRANEXAMIC ACID 1000 MG/10ML IV SOLN
1000.0000 mg | Freq: Once | INTRAVENOUS | Status: AC
  Administered 2016-04-26: 1000 mg via INTRAVENOUS
  Filled 2016-04-26: qty 10

## 2016-04-26 MED ORDER — PROPOFOL 10 MG/ML IV BOLUS
INTRAVENOUS | Status: AC
  Filled 2016-04-26: qty 20

## 2016-04-26 MED ORDER — NEOSTIGMINE METHYLSULFATE 10 MG/10ML IV SOLN
INTRAVENOUS | Status: DC | PRN
  Administered 2016-04-26: 4 mg via INTRAVENOUS

## 2016-04-26 MED ORDER — BISACODYL 10 MG RE SUPP
10.0000 mg | Freq: Every day | RECTAL | Status: DC | PRN
Start: 2016-04-26 — End: 2016-04-28

## 2016-04-26 MED ORDER — FERROUS SULFATE 325 (65 FE) MG PO TABS
325.0000 mg | ORAL_TABLET | Freq: Three times a day (TID) | ORAL | Status: DC
  Administered 2016-04-26 – 2016-04-28 (×4): 325 mg via ORAL
  Filled 2016-04-26 (×5): qty 1

## 2016-04-26 MED ORDER — ONDANSETRON HCL 4 MG/2ML IJ SOLN
INTRAMUSCULAR | Status: DC | PRN
  Administered 2016-04-26: 4 mg via INTRAVENOUS

## 2016-04-26 MED ORDER — CISATRACURIUM BESYLATE (PF) 10 MG/5ML IV SOLN
INTRAVENOUS | Status: DC | PRN
  Administered 2016-04-26: 2 mg via INTRAVENOUS
  Administered 2016-04-26: 12 mg via INTRAVENOUS

## 2016-04-26 MED ORDER — ACETAMINOPHEN 650 MG RE SUPP: 650.0000 mg | Freq: Four times a day (QID) | RECTAL | Status: DC | PRN

## 2016-04-26 MED ORDER — SODIUM CHLORIDE 0.9 % IR SOLN
Status: DC | PRN
  Administered 2016-04-26: 1000 mL

## 2016-04-26 MED ORDER — BUPROPION HCL ER (SR) 150 MG PO TB12
150.0000 mg | ORAL_TABLET | Freq: Two times a day (BID) | ORAL | Status: DC
  Administered 2016-04-26 – 2016-04-28 (×3): 150 mg via ORAL
  Filled 2016-04-26 (×5): qty 1

## 2016-04-26 MED ORDER — CISATRACURIUM BESYLATE 20 MG/10ML IV SOLN
INTRAVENOUS | Status: AC
  Filled 2016-04-26: qty 10

## 2016-04-26 MED ORDER — DIPHENHYDRAMINE HCL 25 MG PO CAPS: 25.0000 mg | ORAL_CAPSULE | Freq: Four times a day (QID) | ORAL | Status: DC | PRN

## 2016-04-26 MED ORDER — HYDROMORPHONE HCL 1 MG/ML IJ SOLN
INTRAMUSCULAR | Status: AC
Start: 2016-04-26 — End: 2016-04-27
  Filled 2016-04-26: qty 1

## 2016-04-26 MED ORDER — PROMETHAZINE HCL 25 MG/ML IJ SOLN: 6.2500 mg | INTRAMUSCULAR | Status: DC | PRN

## 2016-04-26 MED ORDER — ENOXAPARIN SODIUM 30 MG/0.3ML ~~LOC~~ SOLN
30.0000 mg | SUBCUTANEOUS | Status: DC
  Administered 2016-04-27 – 2016-04-28 (×2): 30 mg via SUBCUTANEOUS
  Filled 2016-04-26 (×2): qty 0.3

## 2016-04-26 MED ORDER — HYDROMORPHONE HCL 2 MG/ML IJ SOLN
INTRAMUSCULAR | Status: AC
  Filled 2016-04-26: qty 1

## 2016-04-26 MED ORDER — MENTHOL 3 MG MT LOZG: 1.0000 | LOZENGE | OROMUCOSAL | Status: DC | PRN

## 2016-04-26 MED ORDER — CALCIUM ACETATE (PHOS BINDER) 667 MG PO CAPS: 2001.0000 mg | ORAL_CAPSULE | Freq: Three times a day (TID) | ORAL | Status: DC

## 2016-04-26 MED ORDER — ONDANSETRON HCL 4 MG PO TABS: 4.0000 mg | ORAL_TABLET | Freq: Four times a day (QID) | ORAL | Status: DC | PRN

## 2016-04-26 MED ORDER — PROPOFOL 10 MG/ML IV BOLUS
INTRAVENOUS | Status: DC | PRN
  Administered 2016-04-26: 150 mg via INTRAVENOUS

## 2016-04-26 MED ORDER — METOCLOPRAMIDE HCL 5 MG/ML IJ SOLN: 5.0000 mg | Freq: Three times a day (TID) | INTRAMUSCULAR | Status: DC | PRN

## 2016-04-26 MED ORDER — SODIUM CHLORIDE 0.9 % IV SOLN
INTRAVENOUS | Status: DC
  Administered 2016-04-26: 17:00:00 via INTRAVENOUS

## 2016-04-26 MED ORDER — CALCIUM ACETATE (PHOS BINDER) 667 MG PO CAPS
2001.0000 mg | ORAL_CAPSULE | Freq: Three times a day (TID) | ORAL | Status: DC
  Administered 2016-04-26 – 2016-04-28 (×6): 2001 mg via ORAL
  Filled 2016-04-26 (×9): qty 3

## 2016-04-26 MED ORDER — HYDROMORPHONE HCL 1 MG/ML IJ SOLN
0.2500 mg | INTRAMUSCULAR | Status: DC | PRN
  Administered 2016-04-26 (×2): 0.5 mg via INTRAVENOUS

## 2016-04-26 MED ORDER — ONDANSETRON HCL 4 MG/2ML IJ SOLN
INTRAMUSCULAR | Status: AC
  Filled 2016-04-26: qty 2

## 2016-04-26 MED ORDER — MIDAZOLAM HCL 5 MG/5ML IJ SOLN
INTRAMUSCULAR | Status: DC | PRN
  Administered 2016-04-26: 2 mg via INTRAVENOUS

## 2016-04-26 MED ORDER — METHOCARBAMOL 1000 MG/10ML IJ SOLN: 500.0000 mg | Freq: Four times a day (QID) | INTRAVENOUS | Status: DC | PRN

## 2016-04-26 MED ORDER — FENTANYL CITRATE (PF) 250 MCG/5ML IJ SOLN
INTRAMUSCULAR | Status: AC
  Filled 2016-04-26: qty 5

## 2016-04-26 MED ORDER — HYDROMORPHONE HCL 1 MG/ML IJ SOLN
INTRAMUSCULAR | Status: DC | PRN
  Administered 2016-04-26 (×2): 0.5 mg via INTRAVENOUS

## 2016-04-26 MED ORDER — DOCUSATE SODIUM 100 MG PO CAPS
100.0000 mg | ORAL_CAPSULE | Freq: Two times a day (BID) | ORAL | Status: DC
  Administered 2016-04-27 – 2016-04-28 (×3): 100 mg via ORAL
  Filled 2016-04-26 (×3): qty 1

## 2016-04-26 MED ORDER — ALBUTEROL SULFATE (2.5 MG/3ML) 0.083% IN NEBU: 2.5000 mg | INHALATION_SOLUTION | Freq: Four times a day (QID) | RESPIRATORY_TRACT | Status: DC | PRN

## 2016-04-26 MED ORDER — LEVOTHYROXINE SODIUM 112 MCG PO TABS
112.0000 ug | ORAL_TABLET | Freq: Every day | ORAL | Status: DC
  Administered 2016-04-27 – 2016-04-28 (×2): 112 ug via ORAL
  Filled 2016-04-26 (×2): qty 1

## 2016-04-26 MED ORDER — RENA-VITE PO TABS
1.0000 | ORAL_TABLET | Freq: Every day | ORAL | Status: DC
  Administered 2016-04-26 – 2016-04-28 (×3): 1 via ORAL
  Filled 2016-04-26 (×3): qty 1

## 2016-04-26 MED ORDER — ONDANSETRON HCL 4 MG/2ML IJ SOLN: 4.0000 mg | Freq: Four times a day (QID) | INTRAMUSCULAR | Status: DC | PRN

## 2016-04-26 MED ORDER — DEXAMETHASONE SODIUM PHOSPHATE 10 MG/ML IJ SOLN
INTRAMUSCULAR | Status: AC
  Filled 2016-04-26: qty 1

## 2016-04-26 MED ORDER — ALUM & MAG HYDROXIDE-SIMETH 200-200-20 MG/5ML PO SUSP: 30.0000 mL | ORAL | Status: DC | PRN

## 2016-04-26 MED ORDER — POLYETHYLENE GLYCOL 3350 17 G PO PACK
17.0000 g | PACK | Freq: Two times a day (BID) | ORAL | Status: DC
  Administered 2016-04-27 – 2016-04-28 (×3): 17 g via ORAL
  Filled 2016-04-26 (×3): qty 1

## 2016-04-26 MED ORDER — MIDAZOLAM HCL 2 MG/2ML IJ SOLN
INTRAMUSCULAR | Status: AC
  Filled 2016-04-26: qty 2

## 2016-04-26 MED ORDER — LIDOCAINE HCL (CARDIAC) 20 MG/ML IV SOLN
INTRAVENOUS | Status: AC
  Filled 2016-04-26: qty 5

## 2016-04-26 MED ORDER — LIDOCAINE HCL (CARDIAC) 20 MG/ML IV SOLN
INTRAVENOUS | Status: DC | PRN
  Administered 2016-04-26: 100 mg via INTRAVENOUS

## 2016-04-26 MED ORDER — MAGNESIUM CITRATE PO SOLN: 1.0000 | Freq: Once | ORAL | Status: DC | PRN

## 2016-04-26 MED ORDER — DEXAMETHASONE SODIUM PHOSPHATE 10 MG/ML IJ SOLN
10.0000 mg | Freq: Once | INTRAMUSCULAR | Status: AC
  Administered 2016-04-26: 10 mg via INTRAVENOUS

## 2016-04-26 MED ORDER — PHENOL 1.4 % MT LIQD: 1.0000 | OROMUCOSAL | Status: DC | PRN

## 2016-04-26 MED ORDER — METHOCARBAMOL 500 MG PO TABS: 500.0000 mg | ORAL_TABLET | Freq: Four times a day (QID) | ORAL | Status: DC | PRN

## 2016-04-26 SURGICAL SUPPLY — 37 items
BAG DECANTER FOR FLEXI CONT (MISCELLANEOUS) IMPLANT
BAG SPEC THK2 15X12 ZIP CLS (MISCELLANEOUS)
BAG ZIPLOCK 12X15 (MISCELLANEOUS) IMPLANT
CAPT HIP TOTAL 2 ×2 IMPLANT
CLOTH BEACON ORANGE TIMEOUT ST (SAFETY) ×3 IMPLANT
COVER PERINEAL POST (MISCELLANEOUS) ×3 IMPLANT
DRAPE STERI IOBAN 125X83 (DRAPES) ×3 IMPLANT
DRAPE U-SHAPE 47X51 STRL (DRAPES) ×6 IMPLANT
DRESSING AQUACEL AG SP 3.5X10 (GAUZE/BANDAGES/DRESSINGS) ×1 IMPLANT
DRSG AQUACEL AG ADV 3.5X10 (GAUZE/BANDAGES/DRESSINGS) ×2 IMPLANT
DRSG AQUACEL AG SP 3.5X10 (GAUZE/BANDAGES/DRESSINGS) ×3
DURAPREP 26ML APPLICATOR (WOUND CARE) ×3 IMPLANT
ELECT REM PT RETURN 15FT ADLT (MISCELLANEOUS) IMPLANT
ELECT REM PT RETURN 9FT ADLT (ELECTROSURGICAL) ×3
ELECTRODE REM PT RTRN 9FT ADLT (ELECTROSURGICAL) ×1 IMPLANT
GLOVE BIOGEL M STRL SZ7.5 (GLOVE) ×4 IMPLANT
GLOVE BIOGEL PI IND STRL 7.5 (GLOVE) ×1 IMPLANT
GLOVE BIOGEL PI IND STRL 8.5 (GLOVE) ×1 IMPLANT
GLOVE BIOGEL PI INDICATOR 7.5 (GLOVE) ×8
GLOVE BIOGEL PI INDICATOR 8.5 (GLOVE)
GLOVE ECLIPSE 8.0 STRL XLNG CF (GLOVE) ×6 IMPLANT
GLOVE ORTHO TXT STRL SZ7.5 (GLOVE) ×3 IMPLANT
GOWN STRL REUS W/TWL LRG LVL3 (GOWN DISPOSABLE) ×3 IMPLANT
GOWN STRL REUS W/TWL XL LVL3 (GOWN DISPOSABLE) ×3 IMPLANT
HOLDER FOLEY CATH W/STRAP (MISCELLANEOUS) ×3 IMPLANT
LIQUID BAND (GAUZE/BANDAGES/DRESSINGS) ×3 IMPLANT
PACK ANTERIOR HIP CUSTOM (KITS) ×3 IMPLANT
SAW OSC TIP CART 19.5X105X1.3 (SAW) ×3 IMPLANT
SUT MNCRL AB 4-0 PS2 18 (SUTURE) ×3 IMPLANT
SUT VIC AB 1 CT1 36 (SUTURE) ×9 IMPLANT
SUT VIC AB 2-0 CT1 27 (SUTURE) ×12
SUT VIC AB 2-0 CT1 TAPERPNT 27 (SUTURE) ×2 IMPLANT
SUT VLOC 180 0 24IN GS25 (SUTURE) ×3 IMPLANT
TRAY FOLEY W/METER SILVER 14FR (SET/KITS/TRAYS/PACK) IMPLANT
TRAY FOLEY W/METER SILVER 16FR (SET/KITS/TRAYS/PACK) IMPLANT
WATER STERILE IRR 1500ML POUR (IV SOLUTION) ×3 IMPLANT
YANKAUER SUCT BULB TIP 10FT TU (MISCELLANEOUS) IMPLANT

## 2016-04-26 NOTE — Anesthesia Preprocedure Evaluation (Addendum)
Anesthesia Evaluation  Patient identified by MRN, date of birth, ID band Patient awake    Reviewed: Allergy & Precautions, NPO status , Patient's Chart, lab work & pertinent test results  Airway Mallampati: II  TM Distance: >3 FB Neck ROM: Full    Dental no notable dental hx.    Pulmonary asthma ,    breath sounds clear to auscultation + decreased breath sounds      Cardiovascular negative cardio ROS Normal cardiovascular exam Rhythm:Regular Rate:Normal     Neuro/Psych negative neurological ROS  negative psych ROS   GI/Hepatic negative GI ROS, Neg liver ROS,   Endo/Other  Hypothyroidism Morbid obesity  Renal/GU DialysisRenal disease  negative genitourinary   Musculoskeletal negative musculoskeletal ROS (+)   Abdominal   Peds negative pediatric ROS (+)  Hematology negative hematology ROS (+)   Anesthesia Other Findings   Reproductive/Obstetrics negative OB ROS                            Anesthesia Physical Anesthesia Plan  ASA: III  Anesthesia Plan: General   Post-op Pain Management:    Induction: Intravenous  Airway Management Planned: Oral ETT  Additional Equipment:   Intra-op Plan:   Post-operative Plan: Extubation in OR  Informed Consent: I have reviewed the patients History and Physical, chart, labs and discussed the procedure including the risks, benefits and alternatives for the proposed anesthesia with the patient or authorized representative who has indicated his/her understanding and acceptance.   Dental advisory given  Plan Discussed with: CRNA and Surgeon  Anesthesia Plan Comments:         Anesthesia Quick Evaluation

## 2016-04-26 NOTE — Interval H&P Note (Signed)
History and Physical Interval Note:  04/26/2016 10:11 AM  [name]  has presented today for surgery, with the diagnosis of LEFT HIP OA   The various methods of treatment have been discussed with the patient and family. After consideration of risks, benefits and other options for treatment, the patient has consented to  Procedure(s): LEFT TOTAL HIP ARTHROPLASTY ANTERIOR APPROACH (Left) as a surgical intervention .  The patient's history has been reviewed, patient examined, no change in status, stable for surgery.  I have reviewed the patient's chart and labs.  Questions were answered to the patient's satisfaction.     Shelda PalLIN,Brenya Taulbee D

## 2016-04-26 NOTE — Anesthesia Postprocedure Evaluation (Signed)
Anesthesia Post Note  Patient: [name]  Procedure(s) Performed: Procedure(s) (LRB): LEFT TOTAL HIP ARTHROPLASTY ANTERIOR APPROACH (Left)  Patient location during evaluation: PACU Anesthesia Type: General Level of consciousness: awake and alert Pain management: pain level controlled Vital Signs Assessment: post-procedure vital signs reviewed and stable Respiratory status: spontaneous breathing, nonlabored ventilation, respiratory function stable and patient connected to nasal cannula oxygen Cardiovascular status: blood pressure returned to baseline and stable Postop Assessment: no signs of nausea or vomiting Anesthetic complications: no    Last Vitals:  Filed Vitals:   04/26/16 1430 04/26/16 1445  BP: 126/58 132/48  Pulse: 85 85  Temp: 36.7 C 36.7 C  Resp: 22 15    Last Pain:  Filed Vitals:   04/26/16 1445  PainSc: 5                  Agnieszka Newhouse S

## 2016-04-26 NOTE — Op Note (Signed)
NAME:  [name]                ACCOUNT NO.: [id number]      MEDICAL RECORD NO.: [id number]      [facility]AC[facility]      PHYSICIAN:  Durene RomansLIN,Zarya Lasseigne D  DATE OF BIRTH:  June 04, 1951     DATE OF PROCEDURE:  04/26/2016                                 OPERATIVE REPORT         PREOPERATIVE DIAGNOSIS: Left  hip osteoarthritis.      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      SURGEON:  Madlyn FrankelMatthew D. Charlann Boxerlin, M.D.      ASSISTANT:  Skip MayerBlair Roberts, PA-C      ANESTHESIA:  General.      SPECIMENS:  None.      COMPLICATIONS:  None.      BLOOD LOSS:  300 cc     DRAINS:  None.      INDICATION OF THE PROCEDURE:  [name] is a 65 y.o. female who had   presented to office for evaluation of left hip pain.  Radiographs revealed   progressive degenerative changes with bone-on-bone   articulation to the  hip joint.  The patient had painful limited range of   motion significantly affecting their overall quality of life.  The patient was failing to    respond to conservative measures, and at this point was ready   to proceed with more definitive measures.  The patient has noted progressive   degenerative changes in his hip, progressive problems and dysfunction   with regarding the hip prior to surgery.  Consent was obtained for   benefit of pain relief.  Specific risk of infection, DVT, component   failure, dislocation, need for revision surgery, as well discussion of   the anterior versus posterior approach were reviewed.  Consent was   obtained for benefit of anterior pain relief through an anterior   approach.      PROCEDURE IN DETAIL:  The patient was brought to operative theater.   Once adequate  anesthesia, preoperative antibiotics, 1.5 gm Vancomycin, 1 gm of Tranexamic Acid, and 10 mg of Decadron administered.   The patient was positioned supine on the OSI Hanna table.  Once adequate   padding of boney process was carried out, we had predraped out the hip, and  used fluoroscopy to confirm orientation of the pelvis and position.      The left hip was then prepped and draped from proximal iliac crest to   mid thigh with shower curtain technique.      Time-out was performed identifying the patient, planned procedure, and   extremity.     An incision was then made 2 cm distal and lateral to the   anterior superior iliac spine extending over the orientation of the   tensor fascia lata muscle and sharp dissection was carried down to the   fascia of the muscle and protractor placed in the soft tissues.  The fascia was then incised.  The muscle belly was identified and swept   laterally and retractor placed along the superior neck.  Following   cauterization of the circumflex vessels and removing some pericapsular   fat, a second cobra retractor was placed on the inferior neck.  A third   retractor was placed on the anterior acetabulum after elevating the   anterior rectus.  A L-capsulotomy was along the line of the   superior neck to the trochanteric fossa, then extended proximally and   distally.  Tag sutures were placed and the retractors were then placed   intracapsular.  We then identified the trochanteric fossa and   orientation of my neck cut, confirmed this radiographically   and then made a neck osteotomy with the femur on traction.  The femoral   head was removed without difficulty or complication.  Traction was let   off and retractors were placed posterior and anterior around the   acetabulum.      The labrum and foveal tissue were debrided.  I began reaming with a 45mm   reamer and reamed up to 49mm reamer with good bony bed preparation and a 50mm   cup was chosen.   The final 50mm Pinnacle cup was then impacted under fluoroscopy  to confirm the depth of penetration and orientation with respect to   abduction.  A screw was placed followed by the hole eliminator.  The final   32+4 neitral Altrex liner was impacted with good visualized rim fit.  The cup was positioned anatomically within the acetabular portion of the pelvis.      At this point, the femur was rolled at 80 degrees.  Further capsule was   released off the inferior aspect of the femoral neck.  I then   released the superior capsule proximally.  The hook was placed laterally   along the femur and elevated manually and held in position with the bed   hook.  The leg was then extended and adducted with the leg rolled to 100   degrees of external rotation.  Once the proximal femur was fully   exposed, I used a box osteotome to set orientation.  I then began   broaching with the starting chili pepper broach and passed this by hand and then broached up to just the size 0 broach.  With the 0 broach in place I chose a high offset neck and did a trial reduction.  The offset was appropriate, leg lengths   appeared to be equal, confirmed radiographically.   Given these findings, I went ahead and dislocated the hip, repositioned all   retractors and positioned the right hip in the extended and abducted position.  The final 0 hi Tri Lock stem was   chosen and it was impacted down to the level of neck cut.  Based on this   and the trial reduction, a 32+1 delta ceramic ball was chosen and   impacted onto a clean and dry trunnion, and the hip was reduced.  The   hip had been irrigated throughout the case again at this point.  I did   reapproximate the superior capsular leaflet to the anterior leaflet   using #1 Vicryl.  The fascia of the   tensor fascia lata muscle was then reapproximated using #1 Vicryl.  The   remaining wound was closed with 2-0 Vicryl and running 4-0 Monocryl.   The hip was cleaned, dried,  and dressed sterilely using Dermabond  and   Aquacel dressing.  She was then brought   to recovery room in stable condition tolerating the procedure well.    Skip MayerBlair Roberts, PA-C was present for the entirety of the case involved from   preoperative positioning, perioperative retractor management, general   facilitation of the case, as well as primary wound closure as assistant.            Madlyn FrankelMatthew D. Charlann Boxerlin, M.D.        04/26/2016 12:45 PM

## 2016-04-26 NOTE — Anesthesia Procedure Notes (Signed)
Procedure Name: Intubation Date/Time: 04/26/2016 11:15 AM Performed by: Enriqueta ShutterWILLIFORD, Kayode Petion D Pre-anesthesia Checklist: Patient identified, Emergency Drugs available, Suction available and Patient being monitored Patient Re-evaluated:Patient Re-evaluated prior to inductionOxygen Delivery Method: Circle system utilized Preoxygenation: Pre-oxygenation with 100% oxygen Intubation Type: IV induction Ventilation: Mask ventilation without difficulty Laryngoscope Size: Mac and 4 Grade View: Grade II Tube type: Oral Tube size: 7.0 mm Number of attempts: 2 Airway Equipment and Method: Stylet Placement Confirmation: ETT inserted through vocal cords under direct vision,  positive ETCO2 and breath sounds checked- equal and bilateral Secured at: 21 cm Tube secured with: Tape Dental Injury: Teeth and Oropharynx as per pre-operative assessment

## 2016-04-26 NOTE — Transfer of Care (Signed)
Immediate Anesthesia Transfer of Care Note  Patient: [name]  Procedure(s) Performed: Procedure(s): LEFT TOTAL HIP ARTHROPLASTY ANTERIOR APPROACH (Left)  Patient Location: PACU  Anesthesia Type:General  Level of Consciousness: awake, alert  and oriented  Airway & Oxygen Therapy: Patient Spontanous Breathing and Patient connected to face mask oxygen  Post-op Assessment: Report given to RN and Post -op Vital signs reviewed and stable  Post vital signs: Reviewed and stable  Last Vitals:  Filed Vitals:   04/26/16 0831  BP: 90/60  Pulse: 91  Temp: 36.6 C  Resp: 18    Last Pain:  Filed Vitals:   04/26/16 1002  PainSc: 3       Patients Stated Pain Goal: 4 (04/26/16 1001)  Complications: No apparent anesthesia complications

## 2016-04-27 LAB — BASIC METABOLIC PANEL
Anion gap: 8 (ref 5–15)
BUN: 53 mg/dL — AB (ref 6–20)
CHLORIDE: 104 mmol/L (ref 101–111)
CO2: 25 mmol/L (ref 22–32)
CREATININE: 7.98 mg/dL — AB (ref 0.44–1.00)
Calcium: 8.3 mg/dL — ABNORMAL LOW (ref 8.9–10.3)
GFR, EST AFRICAN AMERICAN: 6 mL/min — AB (ref >60)
GFR, EST NON AFRICAN AMERICAN: 5 mL/min — AB (ref >60)
Glucose, Bld: 135 mg/dL — ABNORMAL HIGH (ref 65–99)
Potassium: 4.8 mmol/L (ref 3.5–5.1)
SODIUM: 137 mmol/L (ref 135–145)

## 2016-04-27 LAB — CBC
HCT: 26 % — ABNORMAL LOW (ref 36.0–46.0)
HEMOGLOBIN: 9.1 g/dL — AB (ref 12.0–15.0)
MCH: 33.1 pg (ref 26.0–34.0)
MCHC: 35 g/dL (ref 30.0–36.0)
MCV: 94.5 fL (ref 78.0–100.0)
PLATELETS: 143 10*3/uL — AB (ref 150–400)
RBC: 2.75 MIL/uL — AB (ref 3.87–5.11)
RDW: 12.9 % (ref 11.5–15.5)
WBC: 9.5 10*3/uL (ref 4.0–10.5)

## 2016-04-27 MED ORDER — ACETAMINOPHEN 325 MG PO TABS: 650.0000 mg | ORAL_TABLET | Freq: Four times a day (QID) | ORAL | Status: AC | PRN

## 2016-04-27 MED ORDER — DOCUSATE SODIUM 100 MG PO CAPS: 100.0000 mg | ORAL_CAPSULE | Freq: Two times a day (BID) | ORAL | Status: AC

## 2016-04-27 MED ORDER — OXYCODONE HCL 5 MG PO TABS: 5.0000 mg | ORAL_TABLET | ORAL | Status: AC | PRN

## 2016-04-27 MED ORDER — ENOXAPARIN SODIUM 30 MG/0.3ML ~~LOC~~ SOLN: 30.0000 mg | SUBCUTANEOUS | Status: AC

## 2016-04-27 MED ORDER — POLYETHYLENE GLYCOL 3350 17 G PO PACK: 17.0000 g | PACK | Freq: Two times a day (BID) | ORAL | Status: AC

## 2016-04-27 NOTE — Progress Notes (Signed)
Physical Therapy Treatment Patient Details Name: [name] MRN: [id number] DOB: Dec 17, 1950 Today's Date: 04/27/2016    History of Present Illness Pt s/p L THR with hx of R THR (06) by posterior approach.  Pt with hx of CKD (home dialysis) and fibromyalgia    PT Comments    Pt extremely fatigued this pm but agreeable to bed therex  Follow Up Recommendations  Home health PT     Equipment Recommendations  None recommended by PT    Recommendations for Other Services OT consult     Precautions / Restrictions Precautions Precautions: Fall Restrictions Weight Bearing Restrictions: No LLE Weight Bearing: Weight bearing as tolerated    Mobility  Bed Mobility               General bed mobility comments: NT - OOB deferred at pt request 2* to fatigue  Transfers                    Ambulation/Gait                 Stairs            Wheelchair Mobility    Modified Rankin (Stroke Patients Only)       Balance                                    Cognition Arousal/Alertness: Awake/alert Behavior During Therapy: WFL for tasks assessed/performed Overall Cognitive Status: Within Functional Limits for tasks assessed                      Exercises Total Joint Exercises Ankle Circles/Pumps: AROM;Both;15 reps;Supine Quad Sets: AROM;Both;10 reps;Supine Gluteal Sets: AROM;Both;10 reps;Supine Heel Slides: AAROM;Left;20 reps;Supine Hip ABduction/ADduction: AAROM;Left;20 reps;Supine    General Comments        Pertinent Vitals/Pain Pain Assessment: 0-10 Pain Score: 4  Pain Location: L hip Pain Descriptors / Indicators: Aching;Sore Pain Intervention(s): Limited activity within patient's tolerance;Monitored during session;Premedicated before session;Ice applied    Home Living                      Prior Function            PT Goals (current goals can now be found in the care plan section) Acute Rehab PT  Goals Patient Stated Goal: Regain IND PT Goal Formulation: With patient Time For Goal Achievement: 04/30/16 Potential to Achieve Goals: Good Progress towards PT goals: Progressing toward goals    Frequency  7X/week    PT Plan Current plan remains appropriate    Co-evaluation             End of Session   Activity Tolerance: Patient tolerated treatment well;Patient limited by fatigue Patient left: in bed;with call bell/phone within reach;with bed alarm set     Time: [id number] PT Time Calculation (min) (ACUTE ONLY): 20 min  Charges:  $Therapeutic Exercise: 8-22 mins                    G Codes:      Andras Grunewald 04/27/2016, 3:19 PM

## 2016-04-27 NOTE — Discharge Instructions (Signed)

## 2016-04-27 NOTE — Evaluation (Signed)
Physical Therapy Evaluation Patient Details Name: [name] MRN: [id number] DOB: 1951-09-14 Today's Date: 04/27/2016   History of Present Illness  Pt s/p L THR with hx of R THR (06) by posterior approach.  Pt with hx of CKD (home dialysis) and fibromyalgia  Clinical Impression  Pt s/p L THR presents with decreased L LE strength/ROM and post op pain limiting functional mobility.  Pt should progress to dc home with family assist and HHPT follow up.    Follow Up Recommendations Home health PT    Equipment Recommendations  None recommended by PT    Recommendations for Other Services OT consult     Precautions / Restrictions Precautions Precautions: Fall Restrictions Weight Bearing Restrictions: No LLE Weight Bearing: Weight bearing as tolerated      Mobility  Bed Mobility               General bed mobility comments: NT - OOB with nursing  Transfers Overall transfer level: Needs assistance Equipment used: None Transfers: Sit to/from Stand Sit to Stand: Min assist         General transfer comment: min cues for LE management  Ambulation/Gait Ambulation/Gait assistance: Min assist;Min guard Ambulation Distance (Feet): 100 Feet Assistive device: Rolling walker (2 wheeled) Gait Pattern/deviations: Step-to pattern;Decreased step length - right;Decreased step length - left;Shuffle;Trunk flexed Gait velocity: decr Gait velocity interpretation: Below normal speed for age/gender General Gait Details: min cues for posture and position from [employer]  Stairs            Wheelchair Mobility    Modified Rankin (Stroke Patients Only)       Balance                                             Pertinent Vitals/Pain Pain Assessment: 0-10 Pain Score: 4  Pain Location: L hip Pain Descriptors / Indicators: Aching;Sore Pain Intervention(s): Limited activity within patient's tolerance;Monitored during session;Premedicated before session;Ice applied     Home Living Family/patient expects to be discharged to:: Private residence Living Arrangements: Spouse/significant other Available Help at Discharge: Family Type of Home: House Home Access: Stairs to enter Entrance Stairs-Rails: Right Entrance Stairs-Number of Steps: 3+1 Home Layout: One level Home Equipment: Environmental consultantWalker - 2 wheels;Cane - single point      Prior Function Level of Independence: Independent               Hand Dominance        Extremity/Trunk Assessment   Upper Extremity Assessment: Overall WFL for tasks assessed           Lower Extremity Assessment: LLE deficits/detail      Cervical / Trunk Assessment: Normal  Communication   Communication: No difficulties;HOH  Cognition Arousal/Alertness: Awake/alert Behavior During Therapy: WFL for tasks assessed/performed Overall Cognitive Status: Within Functional Limits for tasks assessed                      General Comments      Exercises        Assessment/Plan    PT Assessment Patient needs continued PT services  PT Diagnosis Difficulty walking   PT Problem List Decreased strength;Decreased range of motion;Decreased activity tolerance;Decreased mobility;Pain;Obesity;Decreased knowledge of use of DME  PT Treatment Interventions DME instruction;Gait training;Stair training;Functional mobility training;Therapeutic activities;Therapeutic exercise;Patient/family education   PT Goals (Current goals can be found in the  Care Plan section) Acute Rehab PT Goals Patient Stated Goal: Regain IND PT Goal Formulation: With patient Time For Goal Achievement: 04/30/16 Potential to Achieve Goals: Good    Frequency 7X/week   Barriers to discharge        Co-evaluation               End of Session Equipment Utilized During Treatment: Gait belt Activity Tolerance: Patient tolerated treatment well Patient left: in chair;with call bell/phone within reach;with chair alarm set Nurse  Communication: Mobility status         Time: 1030-1056 PT Time Calculation (min) (ACUTE ONLY): 26 min   Charges:   PT Evaluation $PT Eval Low Complexity: 1 Procedure PT Treatments $Gait Training: 8-22 mins   PT G Codes:        Corena Tilson 04/27/2016, 1:01 PM

## 2016-04-27 NOTE — Progress Notes (Signed)
     Subjective: 1 Day Post-Op Procedure(s) (LRB): LEFT TOTAL HIP ARTHROPLASTY ANTERIOR APPROACH (Left)   Seen by Dr. Charlann Boxerlin. Patient reports pain as mild, pain controlled. No events throughout the night. Planning on doing dialysis once she gets home. Ready to be discharged home, if she does well with PT.  Objective:   VITALS:   Filed Vitals:   04/27/16 0429 04/27/16 0519  BP:  117/48  Pulse: 96 86  Temp:  98.3 F (36.8 C)  Resp: 18 16    Dorsiflexion/Plantar flexion intact Incision: dressing C/D/I No cellulitis present Compartment soft  LABS  Recent Labs  04/26/16 0905 04/27/16 0411  HGB 10.1* 9.1*  HCT 29.6* 26.0*  WBC 5.9 9.5  PLT 164 143*     Recent Labs  04/26/16 0905 04/27/16 0411  NA 137 137  K 4.4 4.8  BUN 42* 53*  CREATININE 6.67* 7.98*  GLUCOSE 94 135*     Assessment/Plan: 1 Day Post-Op Procedure(s) (LRB): LEFT TOTAL HIP ARTHROPLASTY ANTERIOR APPROACH (Left) Foley cath d/c'ed Advance diet Up with therapy D/C IV fluids Discharge home with home health  [facility]. Follow up with OLIN,Mattix Imhof D in 2 weeks.  Contact information:  [facility] [address], [address] [place] [id number] [phone]    Morbid Obesity (BMI >40)  Estimated body mass index is 41.82 kg/(m^2) as calculated from the following:   Height as of this encounter: 5\' 3"  (1.6 m).   Weight as of this encounter: 107.049 kg (236 lb). Patient also counseled that weight may inhibit the healing process Patient counseled that losing weight will help with future health issues        Anastasio AuerbachMatthew S. Ruairi Stutsman   PAC  04/27/2016, 9:41 AM

## 2016-04-27 NOTE — Progress Notes (Signed)
OT Cancellation Note  Patient Details Name: [name] MRN: [id number] DOB: [birth year]   Cancelled Treatment:    Reason Eval/Treat Not Completed: OT screened, no needs identified, will sign off.  Pt has had multiple ortho sxs and has all AE/DME.  Pt does not feel that she needs OT. Will sign off.  Ashraf Mesta 04/27/2016, 11:10 AM  [name], OTR/L [phone] 04/27/2016

## 2016-04-28 LAB — BASIC METABOLIC PANEL
Anion gap: 11 (ref 5–15)
BUN: 67 mg/dL — ABNORMAL HIGH (ref 6–20)
CO2: 26 mmol/L (ref 22–32)
Calcium: 9.5 mg/dL (ref 8.9–10.3)
Chloride: 100 mmol/L — ABNORMAL LOW (ref 101–111)
Creatinine, Ser: 9.97 mg/dL — ABNORMAL HIGH (ref 0.44–1.00)
GFR calc Af Amer: 4 mL/min — ABNORMAL LOW (ref >60)
GFR calc non Af Amer: 4 mL/min — ABNORMAL LOW (ref >60)
Glucose, Bld: 108 mg/dL — ABNORMAL HIGH (ref 65–99)
Potassium: 5.2 mmol/L — ABNORMAL HIGH (ref 3.5–5.1)
Sodium: 137 mmol/L (ref 135–145)

## 2016-04-28 LAB — CBC
HCT: 23.9 % — ABNORMAL LOW (ref 36.0–46.0)
HEMOGLOBIN: 8.2 g/dL — AB (ref 12.0–15.0)
MCH: 33.9 pg (ref 26.0–34.0)
MCHC: 34.3 g/dL (ref 30.0–36.0)
MCV: 98.8 fL (ref 78.0–100.0)
PLATELETS: 132 10*3/uL — AB (ref 150–400)
RBC: 2.42 MIL/uL — AB (ref 3.87–5.11)
RDW: 13.2 % (ref 11.5–15.5)
WBC: 7.4 10*3/uL (ref 4.0–10.5)

## 2016-04-28 NOTE — Progress Notes (Signed)
Discharge instructions given to pt by previous RN,  verbalized understanding. Left the unit in stable condition.

## 2016-04-28 NOTE — Progress Notes (Signed)
Physical Therapy Treatment Patient Details Name: [name] ColonelCathy L Waldrip MRN: [id number] DOB: December 16, 1950 Today's Date: 04/28/2016    History of Present Illness Pt s/p L THR with hx of R THR (06) by posterior approach.  Pt with hx of CKD (home dialysis) and fibromyalgia    PT Comments    POD # 2 pm session Performed all THR TE's then assisted to bathroom Pt ready for D/C to home  Follow Up Recommendations  Home health PT     Equipment Recommendations  None recommended by PT    Recommendations for Other Services       Precautions / Restrictions Precautions Precautions: Fall Restrictions Weight Bearing Restrictions: No LLE Weight Bearing: Weight bearing as tolerated    Mobility  Bed Mobility               General bed mobility comments: used a gait belt to self assist L LE off bed and increased time  Transfers Overall transfer level: Needs assistance Equipment used: None Transfers: Sit to/from Stand Sit to Stand: Supervision         General transfer comment: good safety cognition and use of hands  Ambulation/Gait Ambulation/Gait assistance: Supervision Ambulation Distance (Feet): 12 Feet Assistive device: Rolling walker (2 wheeled) Gait Pattern/deviations: Step-to pattern Gait velocity: decr   General Gait Details: min cues for posture and position from RW plus increased time   Stairs            Wheelchair Mobility    Modified Rankin (Stroke Patients Only)       Balance                                    Cognition Arousal/Alertness: Awake/alert Behavior During Therapy: WFL for tasks assessed/performed Overall Cognitive Status: Within Functional Limits for tasks assessed                      Exercises   Total Hip Replacement TE's 10 reps ankle pumps 10 reps knee presses 10 reps heel slides 10 reps SAQ's 10 reps ABD Followed by [facility]    General Comments        Pertinent Vitals/Pain Pain Assessment: 0-10 Pain  Score: 3  Pain Location: L hip Pain Descriptors / Indicators: Sore Pain Intervention(s): Monitored during session;Premedicated before session;Repositioned;Ice applied    Home Living                      Prior Function            PT Goals (current goals can now be found in the care plan section) Progress towards PT goals: Progressing toward goals    Frequency  7X/week    PT Plan Current plan remains appropriate    Co-evaluation             End of Session Equipment Utilized During Treatment: Gait belt Activity Tolerance: Patient tolerated treatment well;Patient limited by fatigue Patient left: in chair;with call bell/phone within reach     Time: [id number] PT Time Calculation (min) (ACUTE ONLY): 31 min  Charges:  $Gait Training: 8-22 mins $Therapeutic Exercise: 8-22 mins                    G Codes:      Felecia ShellingLori Adelia Baptista  PTA [facility]  Acute  Rehab Pager      [phone]

## 2016-04-28 NOTE — Progress Notes (Signed)
     Subjective: 2 Days Post-Op Procedure(s) (LRB): LEFT TOTAL HIP ARTHROPLASTY ANTERIOR APPROACH (Left)   Patient reports pain as mild, pain controlled. Feels much better today as compared to yesterday.  Already has permission to do dialysis at home later today.  Ready to be discharged home.  Objective:   VITALS:   Filed Vitals:   04/27/16 2158 04/28/16 0515  BP: 119/46 138/43  Pulse: 95 97  Temp: 98.1 F (36.7 C) 98.5 F (36.9 C)  Resp: 16 16    Dorsiflexion/Plantar flexion intact Incision: dressing C/D/I No cellulitis present Compartment soft  LABS  Recent Labs  04/26/16 0905 04/27/16 0411 04/28/16 0426  HGB 10.1* 9.1* 8.2*  HCT 29.6* 26.0* 23.9*  WBC 5.9 9.5 7.4  PLT 164 143* 132*     Recent Labs  04/26/16 0905 04/27/16 0411 04/28/16 0426  NA 137 137 137  K 4.4 4.8 5.2*  BUN 42* 53* 67*  CREATININE 6.67* 7.98* 9.97*  GLUCOSE 94 135* 108*     Assessment/Plan: 2 Days Post-Op Procedure(s) (LRB): LEFT TOTAL HIP ARTHROPLASTY ANTERIOR APPROACH (Left) Up with therapy Discharge home with home health  [facility]. Follow up with OLIN,Amere Iott D in 1 and 2 weeks.  Contact information:  [facility] [address] [place] [id number] [phone]    Morbid Obesity (BMI >40)  Estimated body mass index is 41.82 kg/(m^2) as calculated from the following:   Height as of this encounter: 5\' 3"  (1.6 m).   Weight as of this encounter: 107.049 kg (236 lb). Patient also counseled that weight may inhibit the healing process Patient counseled that losing weight will help with future health issues        Anastasio AuerbachMatthew S. Arcenia Scarbro   PAC  04/28/2016, 10:30 AM

## 2016-04-28 NOTE — Care Management Note (Signed)
Case Management Note  Patient Details  Name: [name] MRN: [id number] Date of Birth: 17-Nov-1950  Subjective/Objective:                  LEFT TOTAL HIP ARTHROPLASTY ANTERIOR APPROACH (Left) Action/Plan: Discharge planning Expected Discharge Date:  04/28/16               Expected Discharge Plan:  [place]  In-House Referral:     Discharge planning Services  CM Consult  Post Acute Care Choice:    Choice offered to:  Patient  DME Arranged:  3-N-1 DME Agency:  [place]:  PT [facility] Agency:  [place]  Status of Service:  Completed, signed off  If discussed at [place] of Stay Meetings, dates discussed:   CM met with pt in room to offer choice of home health agency.  Pt chooses [facility] to render HHPT.  Referral called to [facility] rep, Santiago Glad.  Pt's 3n1 has been delivered to room by [facility] DME rep, Germaine.  No other CM needs were communicated. Dellie Catholic, RN 04/28/2016, 9:50 AM

## 2016-04-28 NOTE — Progress Notes (Signed)
Discharge instructions given to patient questions ask and answered.  D Pasqual Farias RN 

## 2016-04-28 NOTE — Progress Notes (Signed)
Physical Therapy Treatment Patient Details Name: [name] MRN: [id number] DOB: May 12, 1951 Today's Date: 04/28/2016    History of Present Illness Pt s/p L THR with hx of R THR (06) by posterior approach.  Pt with hx of CKD (home dialysis) and fibromyalgia    PT Comments    POD # 2  Assisted OOB to amb in hallway.  Practiced stairs.    Follow Up Recommendations  Home health PT     Equipment Recommendations  None recommended by PT    Recommendations for Other Services       Precautions / Restrictions Precautions Precautions: Fall Restrictions Weight Bearing Restrictions: No LLE Weight Bearing: Weight bearing as tolerated    Mobility  Bed Mobility               General bed mobility comments: used a gait belt to self assist L LE off bed and increased time  Transfers Overall transfer level: Needs assistance Equipment used: None Transfers: Sit to/from Stand Sit to Stand: Supervision         General transfer comment: good safety cognition and use of hands  Ambulation/Gait Ambulation/Gait assistance: Supervision Ambulation Distance (Feet): 85 Feet Assistive device: Rolling walker (2 wheeled) Gait Pattern/deviations: Step-to pattern Gait velocity: decr   General Gait Details: min cues for posture and position from RW plus increased time   Stairs    4 steps one rail and one crutch One step forward with walker        Wheelchair Mobility    Modified Rankin (Stroke Patients Only)       Balance                                    Cognition Arousal/Alertness: Awake/alert Behavior During Therapy: WFL for tasks assessed/performed Overall Cognitive Status: Within Functional Limits for tasks assessed                      Exercises      General Comments        Pertinent Vitals/Pain Pain Assessment: 0-10 Pain Score: 3  Pain Location: L hip Pain Descriptors / Indicators: Sore Pain Intervention(s): Monitored during  session;Premedicated before session;Repositioned;Ice applied    Home Living                      Prior Function            PT Goals (current goals can now be found in the care plan section) Progress towards PT goals: Progressing toward goals    Frequency  7X/week    PT Plan Current plan remains appropriate    Co-evaluation             End of Session Equipment Utilized During Treatment: Gait belt Activity Tolerance: Patient tolerated treatment well;Patient limited by fatigue Patient left: in chair;with call bell/phone within reach     Time: 1158-1250 PT Time Calculation (min) (ACUTE ONLY): 52 min  Charges:  $Gait Training: 23-37 mins $Therapeutic Activity: 8-22 mins                    G Codes:      Felecia ShellingLori Teale Goodgame  PTA [facility]  Acute  Rehab Pager      [phone]

## 2016-05-08 NOTE — Discharge Summary (Signed)
Physician Discharge Summary  Patient ID: RAI SINAGRA MRN: [id number] DOB/AGE: 1951/10/07 65 y.o.  Admit date: 04/26/2016 Discharge date: 04/28/2016   Procedures:  Procedure(s) (LRB): LEFT TOTAL HIP ARTHROPLASTY ANTERIOR APPROACH (Left)  Attending Physician:  Dr. Durene Romans   Admission Diagnoses:   Left hip primary OA / pain  Discharge Diagnoses:  Principal Problem:   S/P left THA, AA  Past Medical History  Diagnosis Date  . Asthma   . Morbid obesity (HCC)   . Psoriasis   . Depression   . Fibromyalgia   . Osteoarthritis   . Hypothyroidism   . Anemia   . Shortness of breath dyspnea     with exertion  . Chronic kidney disease     home dialysis 2 days on 1 day off  . Facial laceration 2 weeks ago healing well  . HOH (hard of hearing)     both ears left worse than right  . Cataracts, bilateral     left worse than right    HPI:    [name], 65 y.o. female, has a history of pain and functional disability in the left hip(s) due to arthritis and patient has failed non-surgical conservative treatments for greater than 12 weeks to include NSAID's and/or analgesics, use of assistive devices and activity modification. Onset of symptoms was gradual starting years ago with gradually worsening course since that time.The patient noted prior procedures of the hip to include arthroplasty on the right hip per Dr. Charlann Boxer in 2006. Patient currently rates pain in the left hip at 9 out of 10 with activity. Patient has night pain, worsening of pain with activity and weight bearing, trendelenberg gait, pain that interfers with activities of daily living and pain with passive range of motion. Patient has evidence of periarticular osteophytes and joint space narrowing by imaging studies. This condition presents safety issues increasing the risk of falls. There is no current active infection. Risks, benefits and expectations were discussed with the patient. Risks including but not limited  to the risk of anesthesia, blood clots, nerve damage, blood vessel damage, failure of the prosthesis, infection and up to and including death. Patient understand the risks, benefits and expectations and wishes to proceed with surgery.   PCP: Trevor Iha, MD   Discharged Condition: good  Hospital Course:  Patient underwent the above stated procedure on 04/26/2016. Patient tolerated the procedure well and brought to the recovery room in good condition and subsequently to the floor.  POD #1 BP: 117/48 ; Pulse: 86 ; Temp: 98.3 F (36.8 C) ; Resp: 16 Patient reports pain as mild, pain controlled. No events throughout the night. Planning on doing dialysis once she gets home. Dorsiflexion/plantar flexion intact, incision: dressing C/D/I, no cellulitis present and compartment soft.   LABS  Basename    HGB     9.1  HCT     26.0   POD #2  BP: 138/43 ; Pulse: 97 ; Temp: 98.5 F (36.9 C) ; Resp: 16 Patient reports pain as mild, pain controlled. Feels much better today as compared to yesterday. Already has permission to do dialysis at home later today. Ready to be discharged home. Dorsiflexion/plantar flexion intact, incision: dressing C/D/I, no cellulitis present and compartment soft.   LABS  Basename    HGB     8.2  HCT     23.9    Discharge Exam: General appearance: alert, cooperative and no distress Extremities: Homans sign is negative, no sign of DVT, no  edema, redness or tenderness in the calves or thighs and no ulcers, gangrene or trophic changes  Disposition: Home with follow up in 2 weeks   Follow-up Information    Follow up with Shelda PalLIN,Danne Vasek D, MD. Schedule an appointment as soon as possible for a visit in 2 weeks.   Specialty:  Orthopedic Surgery   Contact information:   [address] [place] [address] [phone]       Follow up with [facility].   Why:  home health physical therapy and 3n1   Contact information:   [address] [place] [address] [phone]       Discharge Instructions    Call MD / Call 911    Complete by:  As directed   If you experience chest pain or shortness of breath, CALL 911 and be transported to the hospital emergency room.  If you develope a fever above 101 F, pus (white drainage) or increased drainage or redness at the wound, or calf pain, call your surgeon's office.     Change dressing    Complete by:  As directed   Maintain surgical dressing until follow up in the clinic. If the edges start to pull up, may reinforce with tape. If the dressing is no longer working, may remove and cover with gauze and tape, but must keep the area dry and clean.  Call with any questions or concerns.     Constipation Prevention    Complete by:  As directed   Drink plenty of fluids.  Prune juice may be helpful.  You may use a stool softener, such as Colace (over the counter) 100 mg twice a day.  Use MiraLax (over the counter) for constipation as needed.     Diet - low sodium heart healthy    Complete by:  As directed      Discharge instructions    Complete by:  As directed   Maintain surgical dressing until follow up in the clinic. If the edges start to pull up, may reinforce with tape. If the dressing is no longer working, may remove and cover with gauze and tape, but must keep the area dry and clean.  [facility]. Call with any questions or concerns.     Increase activity slowly as tolerated    Complete by:  As directed   Weight bearing as tolerated with assist device (walker, cane, etc) as directed, use it as long as suggested by your surgeon or therapist, typically at least 4-6 weeks.     TED hose    Complete by:  As directed   Use stockings (TED hose) for 2 weeks on both leg(s).  You may remove them at night for sleeping.             Medication List    STOP taking these medications        mupirocin nasal ointment 2 %  Commonly known  as:  BACTROBAN     traMADol 50 MG tablet  Commonly known as:  ULTRAM     UNABLE TO FIND      TAKE these medications        acetaminophen 325 MG tablet  Commonly known as:  TYLENOL  Take 2 tablets (650 mg total) by mouth every 6 (six) hours as needed for mild pain (or Fever >/= 101).     albuterol (2.5 MG/3ML) 0.083% nebulizer solution  Commonly known as:  PROVENTIL  Take 3 mLs (2.5 mg total) by nebulization every 6 (six) hours as needed for wheezing.     atorvastatin 40 MG tablet  Commonly known as:  LIPITOR  Take 40 mg by mouth every evening.     buPROPion 150 MG 12 hr tablet  Commonly known as:  WELLBUTRIN SR  Take 150 mg by mouth 2 (two) times daily.     calcium acetate 667 MG capsule  Commonly known as:  PHOSLO  Take 667-2,001 mg by mouth 5 (five) times daily. Take 3 capsules daily with meals and 1-2 capsules with snacks.     docusate sodium 100 MG capsule  Commonly known as:  COLACE  Take 1 capsule (100 mg total) by mouth 2 (two) times daily.     enoxaparin 30 MG/0.3ML injection  Commonly known as:  LOVENOX  Inject 0.3 mLs (30 mg total) into the skin daily.     iron sucrose in sodium chloride 0.9 % 150 mL  Inject into the vein.     levothyroxine 112 MCG tablet  Commonly known as:  SYNTHROID, LEVOTHROID  Take 112 mcg by mouth daily before breakfast.     multivitamin Tabs tablet  Take 1 tablet by mouth daily.     oxyCODONE 5 MG immediate release tablet  Commonly known as:  Oxy IR/ROXICODONE  Take 1-3 tablets (5-15 mg total) by mouth every 4 (four) hours as needed for severe pain.     polyethylene glycol packet  Commonly known as:  MIRALAX / GLYCOLAX  Take 17 g by mouth 2 (two) times daily.         Signed: Anastasio Auerbach. Perpetua Elling   PA-C  05/08/2016, 9:45 AM

## 2016-11-17 IMAGING — DX DG HIP (WITH OR WITHOUT PELVIS) 1V PORT*L*
3 series · 3 of 3 positions shown · non-contrast
Comparison: None.

CLINICAL DATA: Postop left total hip arthroplasty.

EXAM:
OPERATIVE left HIP (WITH PELVIS IF PERFORMED) to the VIEWS
TECHNIQUE: Fluoroscopic spot image(s) were submitted for interpretation
post-operatively.

[pelvis ap]
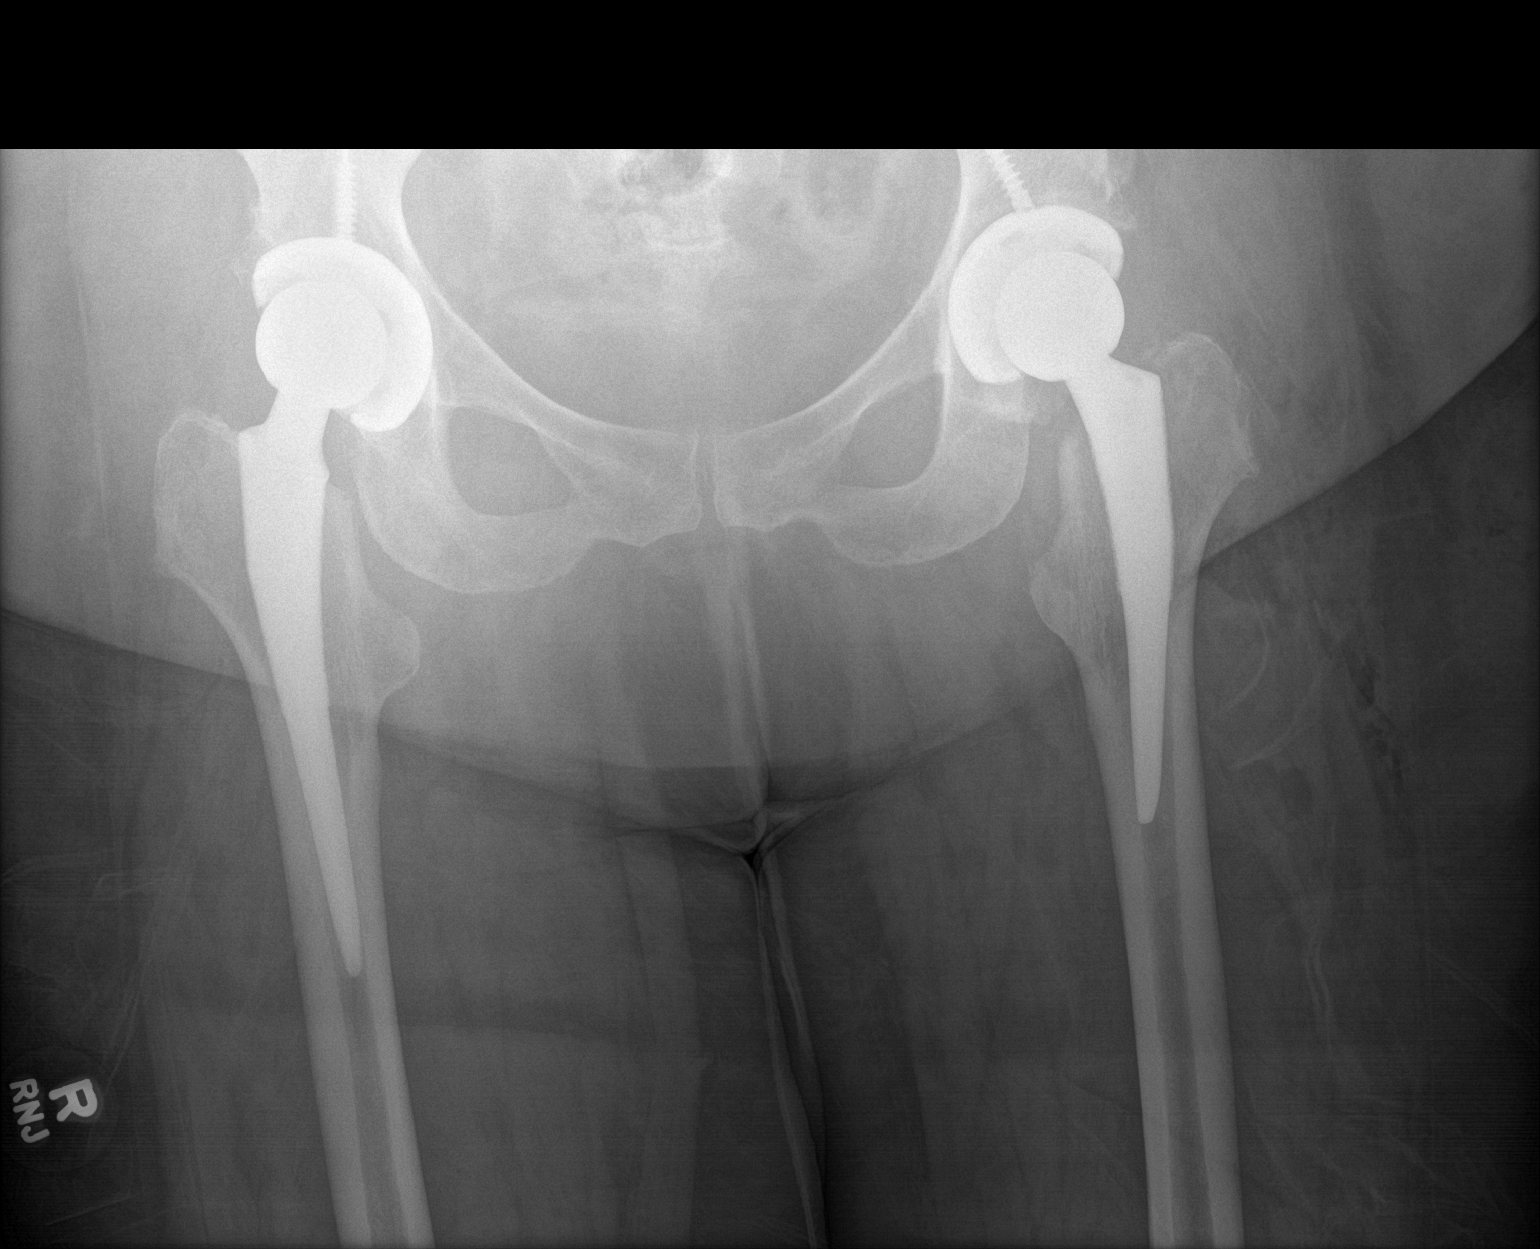

[hip lat (1 of 2)]
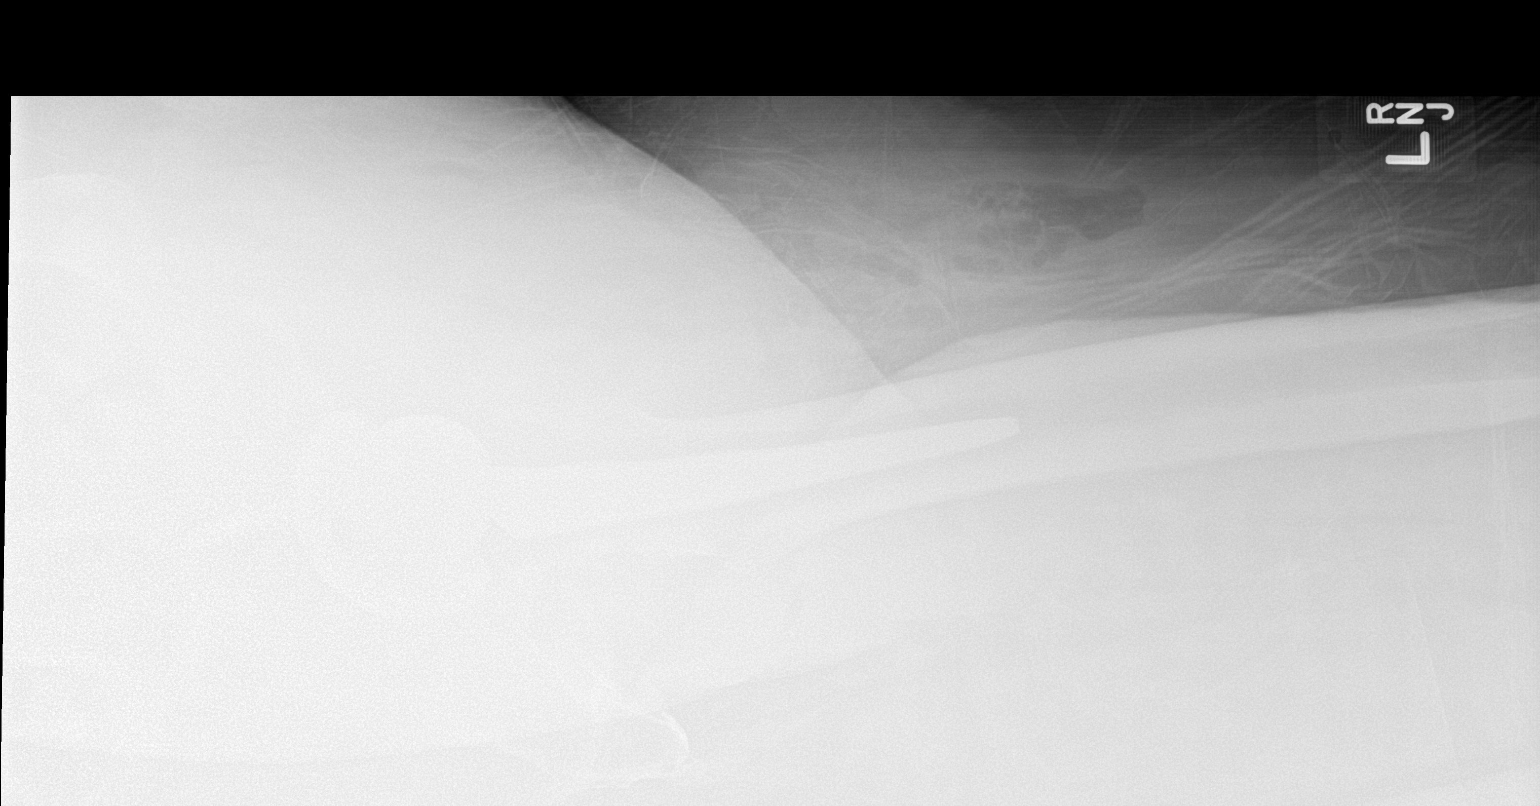

[hip lat (2 of 2)]
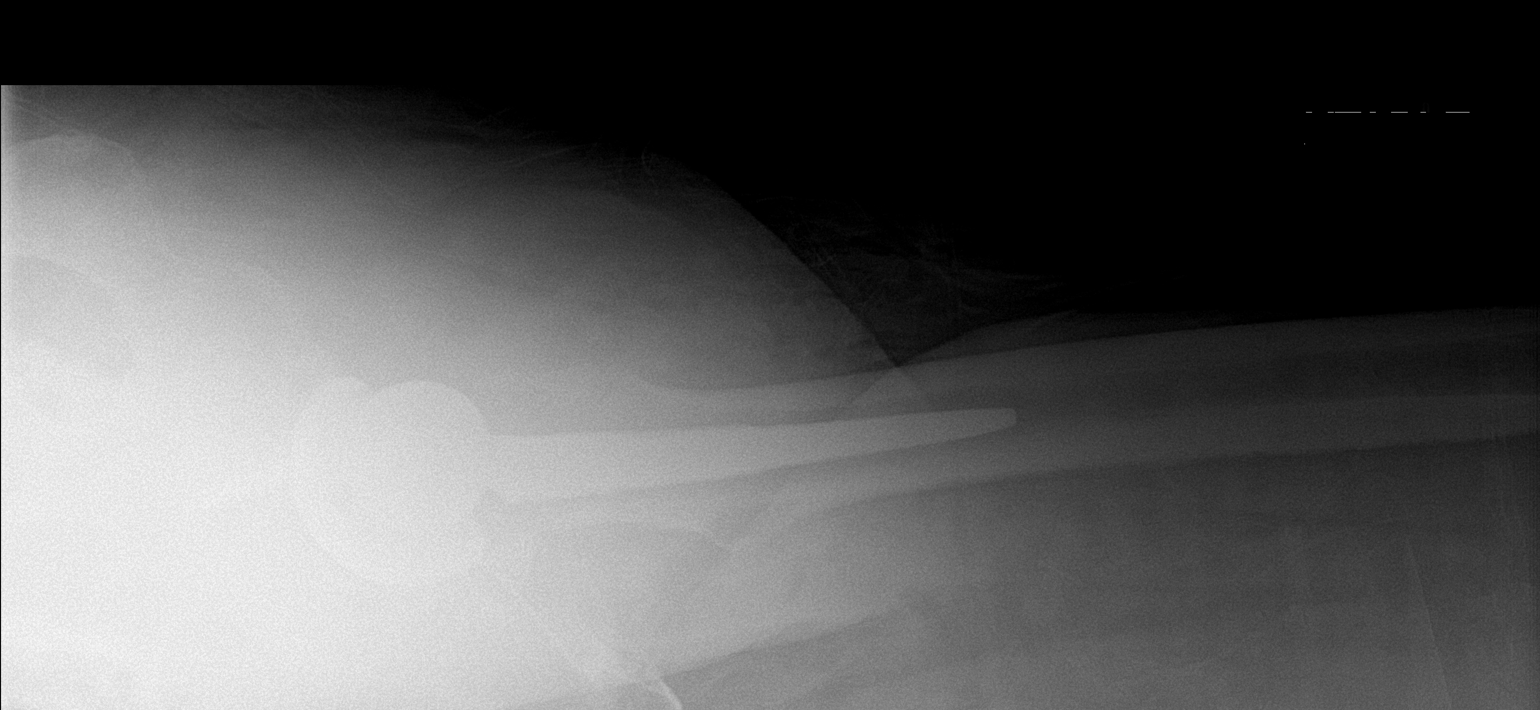

[3 of 3 positions shown; findings below may reference images not displayed]

FINDINGS: Bilateral total hip arthroplasty is noted. The left hip is located.
Gas and fluid are present within the left joint in soft tissues as
expected.
IMPRESSION: Left total hip arthroplasty without radiographic evidence for
complication.

## 2017-06-30 ENCOUNTER — Encounter: Payer: Self-pay | Admitting: *Deleted

## 2017-07-04 ENCOUNTER — Ambulatory Visit: Payer: Medicare Other | Admitting: Diagnostic Neuroimaging

## 2017-07-10 ENCOUNTER — Encounter: Payer: Self-pay | Admitting: Diagnostic Neuroimaging

## 2018-05-09 ENCOUNTER — Encounter (HOSPITAL_COMMUNITY): Payer: Self-pay | Admitting: Emergency Medicine

## 2018-05-09 ENCOUNTER — Emergency Department (HOSPITAL_COMMUNITY)
Admission: EM | Admit: 2018-05-09 | Discharge: 2018-05-09 | Disposition: A | Discharge status: Home / Self Care | Payer: Medicare Other | Attending: Emergency Medicine | Admitting: Emergency Medicine

## 2018-05-09 DIAGNOSIS — J45909 Unspecified asthma, uncomplicated: Secondary | ICD-10-CM | POA: Diagnosis not present

## 2018-05-09 DIAGNOSIS — I9589 Other hypotension: Secondary | ICD-10-CM | POA: Diagnosis not present

## 2018-05-09 DIAGNOSIS — E861 Hypovolemia: Secondary | ICD-10-CM

## 2018-05-09 DIAGNOSIS — Z79899 Other long term (current) drug therapy: Secondary | ICD-10-CM | POA: Insufficient documentation

## 2018-05-09 DIAGNOSIS — I959 Hypotension, unspecified: Secondary | ICD-10-CM | POA: Diagnosis present

## 2018-05-09 DIAGNOSIS — Z992 Dependence on renal dialysis: Secondary | ICD-10-CM | POA: Insufficient documentation

## 2018-05-09 DIAGNOSIS — N189 Chronic kidney disease, unspecified: Secondary | ICD-10-CM | POA: Insufficient documentation

## 2018-05-09 DIAGNOSIS — E039 Hypothyroidism, unspecified: Secondary | ICD-10-CM | POA: Insufficient documentation

## 2018-05-09 NOTE — ED Notes (Signed)
RN attempted to draw lab from patient unsuccessfully x 2. Patient declines further lab draws.

## 2018-05-09 NOTE — Discharge Instructions (Addendum)
Return if any problems.

## 2018-05-09 NOTE — ED Provider Notes (Signed)
[facility] Provider Note   CSN: [id number] Arrival date & time: 05/09/18  1340     History   Chief Complaint Chief Complaint  Patient presents with  . Hypotension    HPI [name] is a 67 y.o. female.  Pt reports she became lightheaded at work after having dialysis.  Pt reports her blood pressure was low and her supervisor called EMS.  Pt reports she had the same thing happen after her last dialysis.  EMS gave pt a liter of fluid.  Pt reports she feels normal.  Pt reports she has been losing weight and her MD has planned to decrease the amount of fluid she has removed.  Pt reports she feels normal and wants to go home.    The history is provided by the patient. No language interpreter was used.    Past Medical History:  Diagnosis Date  . Anemia   . Asthma   . Cataracts, bilateral    left worse than right  . Chronic kidney disease    home dialysis 2 days on 1 day off  . Depression   . Facial laceration 2 weeks ago healing well  . Fibromyalgia   . HOH (hard of hearing)    both ears left worse than right  . Hypothyroidism   . Morbid obesity (HCC)   . Osteoarthritis   . Psoriasis   . Shortness of breath dyspnea    with exertion    Patient Active Problem List   Diagnosis Date Noted  . S/P left THA, AA 04/26/2016    Past Surgical History:  Procedure Laterality Date  . AV FISTULA PLACEMENT Left 12/05/2014   Procedure: INSERTION OF Left Arm  Arteriovenous Fistula ;  Surgeon: Larina Earthlyodd F Early, MD;  [facility];  Service: Vascular;  Laterality: Left;  . COLONOSCOPY W/ POLYPECTOMY    . FISTULA SUPERFICIALIZATION Left 01/26/2015   Procedure: FISTULA SUPERFICIALIZATION- LEFT UPPER ARM;  Surgeon: Larina Earthlyodd F Early, MD;  [facility];  Service: Vascular;  Laterality: Left;  . INSERTION OF DIALYSIS CATHETER N/A 12/05/2014   Procedure: INSERTION OF DIALYSIS CATHETER;  Surgeon: Larina Earthlyodd F Early, MD;  [facility];  Service: Vascular;   Laterality: N/A;  . partial uniknee replacment Left 2012  . ROTATOR CUFF REPAIR Right   . TOTAL HIP ARTHROPLASTY Right 2006   right  . TOTAL HIP ARTHROPLASTY Left 04/26/2016   Procedure: LEFT TOTAL HIP ARTHROPLASTY ANTERIOR APPROACH;  Surgeon: Durene RomansMatthew Olin, MD;  Location: [facility] ORS;  Service: Orthopedics;  Laterality: Left;     OB History   None      Home Medications    Prior to Admission medications   Medication Sig Start Date End Date Taking? Authorizing Provider  acetaminophen (TYLENOL) 325 MG tablet Take 2 tablets (650 mg total) by mouth every 6 (six) hours as needed for mild pain (or Fever >/= 101). 04/27/16   Lanney GinsBabish, Matthew, PA-C  albuterol (PROVENTIL) (2.5 MG/3ML) 0.083% nebulizer solution Take 3 mLs (2.5 mg total) by nebulization every 6 (six) hours as needed for wheezing. 09/13/11 04/14/17  Palumbo, April, MD  atorvastatin (LIPITOR) 40 MG tablet Take 40 mg by mouth every evening.     [provider]  buPROPion (WELLBUTRIN SR) 150 MG 12 hr tablet Take 150 mg by mouth daily.     [provider]  calcium acetate (PHOSLO) 667 MG capsule Take 667-2,001 mg by mouth 5 (five) times daily. Take 3 capsules daily with  meals and 1-2 capsules with snacks.    [provider]  docusate sodium (COLACE) 100 MG capsule Take 1 capsule (100 mg total) by mouth 2 (two) times daily. 04/27/16   Lanney Gins, PA-C  enoxaparin (LOVENOX) 30 MG/0.3ML injection Inject 0.3 mLs (30 mg total) into the skin daily. 04/27/16   Lanney Gins, PA-C  FLUoxetine (PROZAC) 10 MG tablet Take 10 mg by mouth daily.    [provider]  iron sucrose in sodium chloride 0.9 % 150 mL Inject into the vein.    [provider]  levothyroxine (SYNTHROID, LEVOTHROID) 112 MCG tablet Take 112 mcg by mouth daily before breakfast.    [provider]  multivitamin (RENA-VIT) TABS tablet Take 1 tablet by mouth daily.    [provider]  oxyCODONE (OXY IR/ROXICODONE) 5 MG  immediate release tablet Take 1-3 tablets (5-15 mg total) by mouth every 4 (four) hours as needed for severe pain. 04/27/16   Lanney Gins, PA-C  pantoprazole (PROTONIX) 40 MG tablet Take 40 mg by mouth 2 (two) times daily before a meal.    [provider]  polyethylene glycol (MIRALAX / GLYCOLAX) packet Take 17 g by mouth 2 (two) times daily. 04/27/16   Lanney Gins, PA-C  sevelamer carbonate (RENVELA) 800 MG tablet Take 800 mg by mouth 3 (three) times daily with meals.    [provider]  traMADol (ULTRAM) 50 MG tablet Take by mouth 2 (two) times daily.    [provider]    Family History Family History  Problem Relation Age of Onset  . COPD Mother   . Diabetes Mother   . COPD Other   . Diabetes Other     Social History Social History   Tobacco Use  . Smoking status: Never Smoker  . Smokeless tobacco: Never Used  Substance Use Topics  . Alcohol use: No    Alcohol/week: 0.0 oz  . Drug use: No     Allergies   Ivp dye [iodinated diagnostic agents]; Rocephin [ceftriaxone sodium in dextrose]; and Tape   Review of Systems Review of Systems  All other systems reviewed and are negative.    Physical Exam Updated Vital Signs BP (!) 110/55   Pulse 75   Temp 97.9 F (36.6 C) (Oral)   Resp 16   SpO2 98%   Physical Exam  Constitutional: She is oriented to person, place, and time. She appears well-developed and well-nourished.  HENT:  Head: Normocephalic.  Eyes: EOM are normal.  Neck: Normal range of motion.  Cardiovascular: Normal rate and regular rhythm.  Pulmonary/Chest: Effort normal.  Abdominal: She exhibits no distension.  Musculoskeletal: Normal range of motion.  Neurological: She is alert and oriented to person, place, and time.  Psychiatric: She has a normal mood and affect.  Nursing note and vitals reviewed.    ED Treatments / Results  Labs (all labs ordered are listed, but only abnormal results are displayed) Labs  Reviewed  I-STAT CHEM 8, ED    EKG None  Radiology No results found.  Procedures Procedures (including critical care time)  Medications Ordered in ED Medications - No data to display   Initial Impression / Assessment and Plan / ED Course  I have reviewed the triage vital signs and the nursing notes.  Pertinent labs & imaging results that were available during my care of the patient were reviewed by me and considered in my medical decision making (see chart for details).    Blood pressure normal,  Orthostatics normal.    RN unable to get IStat.  Pt declines repeat stick.  Pt states she wants to go home.    Pt advised to discuss with her MD.  Final Clinical Impressions(s) / ED Diagnoses   Final diagnoses:  Hypotension due to hypovolemia    ED Discharge Orders    None    An After Visit Summary was printed and given to the patient.    Elson Areas, Cordelia Poche 05/09/18 1543    Benjiman Core, MD 05/09/18 [phone]

## 2018-05-09 NOTE — ED Triage Notes (Signed)
Pt arrives from dialysis after finishing her treatment she had an episode of being hypotensive with bp of 78/52 on ems arrival. Pt received 500 CC NS en route by ems and on arrival reports feeling "back to normal". Pt states, "They took to much fluid off of me today". Pt is alert and ox4, has no complaints at the time.
